# Patient Record
Sex: Female | Born: 1990 | Race: Black or African American | Hispanic: No | Marital: Single | Smoking: Never smoker
Health system: Southern US, Community
[De-identification: ages and names within clinical notes are randomized; demographics above are authoritative.]

## PROBLEM LIST (undated history)

## (undated) DIAGNOSIS — N926 Irregular menstruation, unspecified: Secondary | ICD-10-CM

## (undated) DIAGNOSIS — J45909 Unspecified asthma, uncomplicated: Secondary | ICD-10-CM

## (undated) DIAGNOSIS — R2689 Other abnormalities of gait and mobility: Secondary | ICD-10-CM

## (undated) HISTORY — DX: Unspecified asthma, uncomplicated: J45.909

## (undated) HISTORY — DX: Irregular menstruation, unspecified: N92.6

## (undated) HISTORY — PX: WISDOM TOOTH EXTRACTION: SHX21

## (undated) HISTORY — DX: Other abnormalities of gait and mobility: R26.89

---

## 2015-08-24 DIAGNOSIS — Z3202 Encounter for pregnancy test, result negative: Secondary | ICD-10-CM | POA: Diagnosis not present

## 2015-08-24 DIAGNOSIS — Z30430 Encounter for insertion of intrauterine contraceptive device: Secondary | ICD-10-CM | POA: Diagnosis not present

## 2015-09-10 DIAGNOSIS — R102 Pelvic and perineal pain: Secondary | ICD-10-CM | POA: Diagnosis not present

## 2015-09-24 DIAGNOSIS — N926 Irregular menstruation, unspecified: Secondary | ICD-10-CM | POA: Diagnosis not present

## 2015-09-27 DIAGNOSIS — Z30432 Encounter for removal of intrauterine contraceptive device: Secondary | ICD-10-CM | POA: Diagnosis not present

## 2015-09-27 DIAGNOSIS — Z30011 Encounter for initial prescription of contraceptive pills: Secondary | ICD-10-CM | POA: Diagnosis not present

## 2016-04-28 DIAGNOSIS — H5213 Myopia, bilateral: Secondary | ICD-10-CM | POA: Diagnosis not present

## 2016-08-04 DIAGNOSIS — N926 Irregular menstruation, unspecified: Secondary | ICD-10-CM | POA: Diagnosis not present

## 2016-08-04 DIAGNOSIS — Z30011 Encounter for initial prescription of contraceptive pills: Secondary | ICD-10-CM | POA: Diagnosis not present

## 2016-08-19 ENCOUNTER — Emergency Department (HOSPITAL_COMMUNITY)
Admission: EM | Admit: 2016-08-19 | Discharge: 2016-08-19 | Disposition: A | Discharge status: Home / Self Care | Payer: BLUE CROSS/BLUE SHIELD | Attending: Emergency Medicine | Admitting: Emergency Medicine

## 2016-08-19 ENCOUNTER — Encounter (HOSPITAL_COMMUNITY): Payer: Self-pay | Admitting: Emergency Medicine

## 2016-08-19 DIAGNOSIS — R531 Weakness: Secondary | ICD-10-CM | POA: Insufficient documentation

## 2016-08-19 DIAGNOSIS — R42 Dizziness and giddiness: Secondary | ICD-10-CM | POA: Diagnosis not present

## 2016-08-19 LAB — COMPREHENSIVE METABOLIC PANEL
ALT: 28 U/L (ref 14–54)
ANION GAP: 10 (ref 5–15)
AST: 26 U/L (ref 15–41)
Albumin: 4.6 g/dL (ref 3.5–5.0)
Alkaline Phosphatase: 52 U/L (ref 38–126)
BILIRUBIN TOTAL: 0.7 mg/dL (ref 0.3–1.2)
BUN: 10 mg/dL (ref 6–20)
CHLORIDE: 104 mmol/L (ref 101–111)
CO2: 21 mmol/L — ABNORMAL LOW (ref 22–32)
Calcium: 9.5 mg/dL (ref 8.9–10.3)
Creatinine, Ser: 0.74 mg/dL (ref 0.44–1.00)
Glucose, Bld: 101 mg/dL — ABNORMAL HIGH (ref 65–99)
POTASSIUM: 3.5 mmol/L (ref 3.5–5.1)
Sodium: 135 mmol/L (ref 135–145)
TOTAL PROTEIN: 7.9 g/dL (ref 6.5–8.1)

## 2016-08-19 LAB — CBC
HEMATOCRIT: 44.7 % (ref 36.0–46.0)
Hemoglobin: 14.7 g/dL (ref 12.0–15.0)
MCH: 27.6 pg (ref 26.0–34.0)
MCHC: 32.9 g/dL (ref 30.0–36.0)
MCV: 84 fL (ref 78.0–100.0)
Platelets: 264 10*3/uL (ref 150–400)
RBC: 5.32 MIL/uL — AB (ref 3.87–5.11)
RDW: 13.6 % (ref 11.5–15.5)
WBC: 4.9 10*3/uL (ref 4.0–10.5)

## 2016-08-19 LAB — URINALYSIS, ROUTINE W REFLEX MICROSCOPIC
Bilirubin Urine: NEGATIVE
GLUCOSE, UA: NEGATIVE mg/dL
Hgb urine dipstick: NEGATIVE
KETONES UR: NEGATIVE mg/dL
LEUKOCYTES UA: NEGATIVE
Nitrite: NEGATIVE
PH: 6 (ref 5.0–8.0)
Protein, ur: NEGATIVE mg/dL
SPECIFIC GRAVITY, URINE: 1.008 (ref 1.005–1.030)

## 2016-08-19 LAB — LIPASE, BLOOD: LIPASE: 30 U/L (ref 11–51)

## 2016-08-19 LAB — I-STAT BETA HCG BLOOD, ED (MC, WL, AP ONLY): I-stat hCG, quantitative: <5 m[IU]/mL (ref <5)

## 2016-08-19 NOTE — ED Provider Notes (Signed)
[facility]-EMERGENCY DEPT Provider Note   CSN: [id number] Arrival date & time: 08/19/16  1307     History   Chief Complaint Chief Complaint  Patient presents with  . Weakness    HPI Kelly Thompson is a 26 y.o. female.  HPI Pt comes in with cc of weakness. Pt has no medical hx, she is on an OCP. She reports that for the last several days she has been feeling weak. Pt denies emesis, fevers, chills, chest pains, shortness of breath, cough, headaches, abdominal pain, uti like symptoms. ROS is + for nausea, anorexia. Pt denies drug use and there is no family hx of autoimmune conditions. Pt has no numbness, tingling. She denies tick bites, rash, sick contacts, outdoor exposures, tropical vacations. Pt reports that she gets dizzy sometimes when she gets up or when she is ambulating. She has had near fainting, but no syncope.  Pt was seen at [facility], and she was asked to come to the ER.  History reviewed. No pertinent past medical history.  There are no active problems to display for this patient.   History reviewed. No pertinent surgical history.  OB History    No data available       Home Medications    Prior to Admission medications   Not on File    Family History History reviewed. No pertinent family history.  Social History Social History  Substance Use Topics  . Smoking status: Never Smoker  . Smokeless tobacco: Never Used  . Alcohol use Yes     Comment: occ     Allergies   Patient has no known allergies.   Review of Systems Review of Systems  Constitutional: Positive for activity change, appetite change and fatigue. Negative for unexpected weight change.  Respiratory: Negative for shortness of breath.   Cardiovascular: Negative for chest pain.  Gastrointestinal: Positive for nausea. Negative for diarrhea.  Neurological: Positive for weakness.  All other systems reviewed and are negative.    Physical Exam Updated Vital Signs BP 116/70   Pulse 60   Temp 98.6  F (37 C) (Oral)   Resp 16   SpO2 100%   Physical Exam  Constitutional: She is oriented to person, place, and time. She appears well-developed and well-nourished.  HENT:  Head: Normocephalic and atraumatic.  Eyes: EOM are normal.  Neck: Neck supple.  Cardiovascular: Normal rate.   Musculoskeletal: She exhibits no edema.  Neurological: She is alert and oriented to person, place, and time.  Nursing note and vitals reviewed.    ED Treatments / Results  Labs (all labs ordered are listed, but only abnormal results are displayed) Labs Reviewed  COMPREHENSIVE METABOLIC PANEL - Abnormal; Notable for the following:       Result Value   CO2 21 (*)    Glucose, Bld 101 (*)    All other components within normal limits  CBC - Abnormal; Notable for the following:    RBC 5.32 (*)    All other components within normal limits  URINALYSIS, ROUTINE W REFLEX MICROSCOPIC - Abnormal; Notable for the following:    Color, Urine STRAW (*)    All other components within normal limits  LIPASE, BLOOD  I-STAT BETA HCG BLOOD, ED ([facility], [facility], [facility] ONLY)    EKG  EKG Interpretation None       Radiology No results found.  Procedures Procedures (including critical care time)  Medications Ordered in ED Medications - No data to display   Initial Impression / Assessment and Plan /  ED Course  I have reviewed the triage vital signs and the nursing notes.  Pertinent labs & imaging results that were available during my care of the patient were reviewed by me and considered in my medical decision making (see chart for details).     Pt comes in with generalized weakness and dizziness/  Near fainting. I dont think pt has PE. She is on OCP, but has no signs of DVT and there is no chest pain, dib. EKG is normal. And PE wouldn't explain the generalized weakness.  Pt hx is completely clear from medical point.  She was started on anti-depressants ant one point - and she is having some + on depression  screen.   Advised PCP f/u.  Multiple PCP f/u info given.  Final Clinical Impressions(s) / ED Diagnoses   Final diagnoses:  Generalized weakness    New Prescriptions New Prescriptions   No medications on file     Derwood KaplanNanavati, Ajeenah Heiny, MD 08/19/16 2005

## 2016-08-19 NOTE — ED Triage Notes (Signed)
[facility] and fatigue [facility] several weeks

## 2016-08-19 NOTE — Discharge Instructions (Signed)
We saw you in the ER for the weakness, near fainting. All the results in the ER are normal, labs and imaging. We are not sure what is causing your symptoms. The workup in the ER is not complete, and is limited to screening for life threatening and emergent conditions only, so please see a primary care doctor for further evaluation.

## 2016-08-19 NOTE — ED Notes (Signed)
Discharge vitals in and pt getting dressed at this time.  

## 2016-08-20 ENCOUNTER — Telehealth: Payer: Self-pay | Admitting: Surgery

## 2016-08-20 DIAGNOSIS — R42 Dizziness and giddiness: Secondary | ICD-10-CM | POA: Diagnosis not present

## 2016-08-20 NOTE — Telephone Encounter (Signed)
ED CM received consult to assist with establishing primary care. CM contacted patient to discuss establishing  primary care, patient reports [employer] to be her insurance coverage. CM explained that she should contact her insurance regarding finding a PCP in network accepting new patient's. Patient verbalized understanding teach back done. No further ED CM needs identified.

## 2016-08-21 DIAGNOSIS — R5383 Other fatigue: Secondary | ICD-10-CM | POA: Diagnosis not present

## 2016-08-21 DIAGNOSIS — R946 Abnormal results of thyroid function studies: Secondary | ICD-10-CM | POA: Diagnosis not present

## 2016-09-09 ENCOUNTER — Inpatient Hospital Stay (INDEPENDENT_AMBULATORY_CARE_PROVIDER_SITE_OTHER): Payer: BLUE CROSS/BLUE SHIELD | Admitting: Physician Assistant

## 2016-10-13 ENCOUNTER — Ambulatory Visit: Payer: BLUE CROSS/BLUE SHIELD | Admitting: Neurology

## 2016-10-15 ENCOUNTER — Encounter: Payer: Self-pay | Admitting: Neurology

## 2016-11-10 ENCOUNTER — Other Ambulatory Visit: Payer: Self-pay | Admitting: Obstetrics and Gynecology

## 2016-11-10 ENCOUNTER — Other Ambulatory Visit (HOSPITAL_COMMUNITY)
Admission: RE | Admit: 2016-11-10 | Discharge: 2016-11-10 | Disposition: A | Discharge status: Home / Self Care | Payer: BLUE CROSS/BLUE SHIELD | Source: Ambulatory Visit | Attending: Obstetrics and Gynecology | Admitting: Obstetrics and Gynecology

## 2016-11-10 DIAGNOSIS — Z124 Encounter for screening for malignant neoplasm of cervix: Secondary | ICD-10-CM | POA: Diagnosis not present

## 2016-11-10 DIAGNOSIS — Z01411 Encounter for gynecological examination (general) (routine) with abnormal findings: Secondary | ICD-10-CM | POA: Diagnosis not present

## 2016-11-11 ENCOUNTER — Other Ambulatory Visit: Payer: Self-pay | Admitting: Obstetrics and Gynecology

## 2016-11-11 DIAGNOSIS — N644 Mastodynia: Secondary | ICD-10-CM

## 2016-11-12 LAB — CYTOLOGY - PAP
Chlamydia: NEGATIVE
DIAGNOSIS: NEGATIVE
NEISSERIA GONORRHEA: NEGATIVE

## 2016-12-08 ENCOUNTER — Ambulatory Visit
Admission: RE | Admit: 2016-12-08 | Discharge: 2016-12-08 | Disposition: A | Discharge status: Home / Self Care | Payer: BLUE CROSS/BLUE SHIELD | Source: Ambulatory Visit | Attending: Obstetrics and Gynecology | Admitting: Obstetrics and Gynecology

## 2016-12-08 DIAGNOSIS — N6489 Other specified disorders of breast: Secondary | ICD-10-CM | POA: Diagnosis not present

## 2016-12-08 DIAGNOSIS — N644 Mastodynia: Secondary | ICD-10-CM

## 2016-12-26 DIAGNOSIS — R531 Weakness: Secondary | ICD-10-CM | POA: Diagnosis not present

## 2017-02-23 DIAGNOSIS — R531 Weakness: Secondary | ICD-10-CM | POA: Diagnosis not present

## 2017-02-23 DIAGNOSIS — R29898 Other symptoms and signs involving the musculoskeletal system: Secondary | ICD-10-CM | POA: Diagnosis not present

## 2017-04-21 DIAGNOSIS — Z3202 Encounter for pregnancy test, result negative: Secondary | ICD-10-CM | POA: Diagnosis not present

## 2017-04-21 DIAGNOSIS — R102 Pelvic and perineal pain: Secondary | ICD-10-CM | POA: Diagnosis not present

## 2017-05-28 DIAGNOSIS — R102 Pelvic and perineal pain: Secondary | ICD-10-CM | POA: Diagnosis not present

## 2017-06-11 DIAGNOSIS — R102 Pelvic and perineal pain: Secondary | ICD-10-CM | POA: Diagnosis not present

## 2017-11-16 ENCOUNTER — Other Ambulatory Visit (HOSPITAL_COMMUNITY)
Admission: RE | Admit: 2017-11-16 | Discharge: 2017-11-16 | Disposition: A | Discharge status: Home / Self Care | Payer: BLUE CROSS/BLUE SHIELD | Source: Ambulatory Visit | Attending: Obstetrics and Gynecology | Admitting: Obstetrics and Gynecology

## 2017-11-16 ENCOUNTER — Other Ambulatory Visit: Payer: Self-pay | Admitting: Obstetrics and Gynecology

## 2017-11-16 DIAGNOSIS — Z01411 Encounter for gynecological examination (general) (routine) with abnormal findings: Secondary | ICD-10-CM | POA: Diagnosis not present

## 2017-11-16 DIAGNOSIS — Z113 Encounter for screening for infections with a predominantly sexual mode of transmission: Secondary | ICD-10-CM | POA: Diagnosis not present

## 2017-11-16 DIAGNOSIS — Z01419 Encounter for gynecological examination (general) (routine) without abnormal findings: Secondary | ICD-10-CM | POA: Diagnosis not present

## 2017-11-17 LAB — CYTOLOGY - PAP
CHLAMYDIA, DNA PROBE: NEGATIVE
DIAGNOSIS: NEGATIVE
NEISSERIA GONORRHEA: NEGATIVE

## 2018-01-04 DIAGNOSIS — R109 Unspecified abdominal pain: Secondary | ICD-10-CM | POA: Diagnosis not present

## 2018-01-20 DIAGNOSIS — K219 Gastro-esophageal reflux disease without esophagitis: Secondary | ICD-10-CM | POA: Diagnosis not present

## 2018-02-08 DIAGNOSIS — R195 Other fecal abnormalities: Secondary | ICD-10-CM | POA: Diagnosis not present

## 2018-02-08 DIAGNOSIS — K219 Gastro-esophageal reflux disease without esophagitis: Secondary | ICD-10-CM | POA: Diagnosis not present

## 2018-03-02 DIAGNOSIS — R197 Diarrhea, unspecified: Secondary | ICD-10-CM | POA: Diagnosis not present

## 2018-03-04 DIAGNOSIS — Z8619 Personal history of other infectious and parasitic diseases: Secondary | ICD-10-CM | POA: Diagnosis not present

## 2018-03-04 DIAGNOSIS — R102 Pelvic and perineal pain: Secondary | ICD-10-CM | POA: Diagnosis not present

## 2018-03-04 DIAGNOSIS — N898 Other specified noninflammatory disorders of vagina: Secondary | ICD-10-CM | POA: Diagnosis not present

## 2018-03-16 DIAGNOSIS — R102 Pelvic and perineal pain: Secondary | ICD-10-CM | POA: Diagnosis not present

## 2018-12-29 ENCOUNTER — Other Ambulatory Visit (HOSPITAL_COMMUNITY)
Admission: RE | Admit: 2018-12-29 | Discharge: 2018-12-29 | Disposition: A | Discharge status: Home / Self Care | Payer: BC Managed Care – PPO | Source: Ambulatory Visit | Attending: Obstetrics and Gynecology | Admitting: Obstetrics and Gynecology

## 2018-12-29 ENCOUNTER — Other Ambulatory Visit: Payer: Self-pay | Admitting: Obstetrics and Gynecology

## 2018-12-29 DIAGNOSIS — Z01419 Encounter for gynecological examination (general) (routine) without abnormal findings: Secondary | ICD-10-CM | POA: Diagnosis present

## 2019-01-03 LAB — CYTOLOGY - PAP
Adequacy: ABSENT
Chlamydia: NEGATIVE
Diagnosis: NEGATIVE
Neisseria Gonorrhea: NEGATIVE

## 2019-04-01 NOTE — L&D Delivery Note (Signed)
Kelly Thompson is a 29 y.o. G1P0 s/p SVD at [redacted]w[redacted]d. She was admitted for SOL.   ROM: 23h 72m with yellowish fluid GBS Status: Negative Maximum Maternal Temperature: 102.8  Labor Progress: . Patient was initially planning a waterbirth and was laboring natural. AROM performed @ 0523 on 10/25. Stalled progression throughout of the morning of 10/26 and pitocin augmentation initiated. Patient proceeded to get an epidural. She continued to progress to complete and pushed 45 minutes to deliver.   Delivery Date/Time: 01/24/20 @ 0518 Delivery:  Called to room and patient was complete and pushing. Head delivered OA. No nuchal cord present. Shoulder and body delivered in usual fashion. Infant with spontaneous cry, placed on mother's abdomen, dried and stimulated. Cord clamped x 2 after 1-minute delay, and cut by father of baby Kelly Thompson. Cord blood drawn. Placenta delivered spontaneously with gentle cord traction- patient plans to keep placenta and placed in cooler. Fundus firm with massage and Pitocin. Labia, perineum, vagina, and cervix inspected inspected with left labial superficial laceration, hemostatic and not repaired.   . After about 5 minutes baby boy "Kelly Thompson" started having retractions and increased thick yellow secretions. Taken over to warmer for stimulation and suction. Neonatologist notified and called to room. Blow by oxygen initiated and baby taken to NICU for transitioning and continued oxygen at this time.   Mom prior to transfer to postpartum. Baby to NICU at this time. She plans on breastfeeding. She requests IUD for birth control- patient plans to be done in office at postpartum appointment.  Delivery Note At 5:18 AM a viable and healthy female was delivered via Vaginal, Spontaneous (Presentation:   Occiput Anterior).  APGAR: 7, 8; weight pending.   Placenta status: Spontaneous, Intact.  Cord: 3 vessels with the following complications: None.   Anesthesia: Epidural Episiotomy:  None Lacerations: Labial Suture Repair: None Est. Blood Loss (mL): 75  Mom to postpartum.  Baby to NICU.  Kelly Thompson CNM 01/24/2020, 5:58 AM

## 2019-05-12 LAB — HCG, SERUM, QUALITATIVE: Beta hcg, urine, semiquantitative: POSITIVE

## 2019-06-13 LAB — OB RESULTS CONSOLE HGB/HCT, BLOOD
HCT: 43 — AB (ref 29–41)
Hemoglobin: 14.1

## 2019-06-13 LAB — OB RESULTS CONSOLE RPR: RPR: NONREACTIVE

## 2019-06-13 LAB — OB RESULTS CONSOLE HIV ANTIBODY (ROUTINE TESTING): HIV: NONREACTIVE

## 2019-06-13 LAB — URINALYSIS, MANUAL ONLY
Bilirubin (Urine): NEGATIVE
Blood, UA: NEGATIVE
Glucose, Ur: NEGATIVE
Ketones, urine: NEGATIVE
Leukocyte Esterase: NEGATIVE
Nitrite, UA: NEGATIVE
Protein, Ur: NEGATIVE
Specific Gravity: <=1.005
Urobilinogen, UA: 0.2
pH: 6.5

## 2019-06-13 LAB — OB RESULTS CONSOLE RUBELLA ANTIBODY, IGM: Rubella: IMMUNE

## 2019-06-13 LAB — OB RESULTS CONSOLE ABO/RH: RH Type: POSITIVE

## 2019-06-13 LAB — HGB FRAC BY HPLC+SOLUBILITY: Hgb Solubility: NEGATIVE

## 2019-06-13 LAB — HIV ANTIBODY (ROUTINE TESTING W REFLEX): HIV Screen 4th Generation wRfx: NONREACTIVE

## 2019-06-13 LAB — PREGNANCY, URINE: Preg Test, Ur: POSITIVE

## 2019-06-13 LAB — CULTURE, OB URINE: Urine Culture, OB: NORMAL

## 2019-06-13 LAB — OB RESULTS CONSOLE PLATELET COUNT: Platelets: 272

## 2019-06-13 LAB — OB RESULTS CONSOLE ANTIBODY SCREEN: Antibody Screen: NEGATIVE

## 2019-06-13 LAB — OB RESULTS CONSOLE HEPATITIS B SURFACE ANTIGEN: Hepatitis B Surface Ag: NEGATIVE

## 2019-07-18 LAB — OB RESULTS CONSOLE GC/CHLAMYDIA
Chlamydia: NEGATIVE
Gonorrhea: NEGATIVE

## 2019-08-26 LAB — AFP TETRA
AFP MoM: 1.17
AFP Value, Am: 64.1
AFP: NEGATIVE
DIA MoM: 132.32
DSR (By Age     1 IN: 918
DSR (Second Trimester) 1 IN: 8516
GESTATIONAL AGE: 19.4
HCG MoM: 1.46
Maternal Age At EDD: 27.1
OSBR Risk 1 IN: 10000
T18 (By Age): 1:3576 {titer}
Test Results:: NEGATIVE
Weight: 169
hCG Value: 33569
uE3 MoM: 0.91
uE3 Value: 1.72

## 2019-09-07 ENCOUNTER — Inpatient Hospital Stay (HOSPITAL_COMMUNITY)
Admission: AD | Admit: 2019-09-07 | Discharge: 2019-09-07 | Disposition: A | Discharge status: Home / Self Care | Payer: Medicaid Other | Attending: Obstetrics & Gynecology | Admitting: Obstetrics & Gynecology

## 2019-09-07 ENCOUNTER — Other Ambulatory Visit: Payer: Self-pay

## 2019-09-07 ENCOUNTER — Encounter (HOSPITAL_COMMUNITY): Payer: Self-pay | Admitting: Obstetrics & Gynecology

## 2019-09-07 DIAGNOSIS — Z3A21 21 weeks gestation of pregnancy: Secondary | ICD-10-CM

## 2019-09-07 DIAGNOSIS — O99412 Diseases of the circulatory system complicating pregnancy, second trimester: Secondary | ICD-10-CM | POA: Diagnosis not present

## 2019-09-07 DIAGNOSIS — R002 Palpitations: Secondary | ICD-10-CM | POA: Diagnosis not present

## 2019-09-07 LAB — CBC
HCT: 36.7 % (ref 36.0–46.0)
Hemoglobin: 12 g/dL (ref 12.0–15.0)
MCH: 27.7 pg (ref 26.0–34.0)
MCHC: 32.7 g/dL (ref 30.0–36.0)
MCV: 84.8 fL (ref 80.0–100.0)
Platelets: 236 10*3/uL (ref 150–400)
RBC: 4.33 MIL/uL (ref 3.87–5.11)
RDW: 13.9 % (ref 11.5–15.5)
WBC: 8 10*3/uL (ref 4.0–10.5)
nRBC: 0 % (ref 0.0–0.2)

## 2019-09-07 LAB — COMPREHENSIVE METABOLIC PANEL
ALT: 18 U/L (ref 0–44)
AST: 17 U/L (ref 15–41)
Albumin: 3.1 g/dL — ABNORMAL LOW (ref 3.5–5.0)
Alkaline Phosphatase: 48 U/L (ref 38–126)
Anion gap: 11 (ref 5–15)
BUN: 6 mg/dL (ref 6–20)
CO2: 23 mmol/L (ref 22–32)
Calcium: 9.2 mg/dL (ref 8.9–10.3)
Chloride: 102 mmol/L (ref 98–111)
Creatinine, Ser: 0.6 mg/dL (ref 0.44–1.00)
GFR calc Af Amer: >60 mL/min (ref >60)
GFR calc non Af Amer: >60 mL/min (ref >60)
Glucose, Bld: 81 mg/dL (ref 70–99)
Potassium: 3.3 mmol/L — ABNORMAL LOW (ref 3.5–5.1)
Sodium: 136 mmol/L (ref 135–145)
Total Bilirubin: 0.3 mg/dL (ref 0.3–1.2)
Total Protein: 6.6 g/dL (ref 6.5–8.1)

## 2019-09-07 LAB — TSH: TSH: 0.828 u[IU]/mL (ref 0.350–4.500)

## 2019-09-07 NOTE — Discharge Instructions (Signed)
Palpitations Palpitations are feelings that your heartbeat is not normal. Your heartbeat may feel like it is:  Uneven.  Faster than normal.  Fluttering.  Skipping a beat. This is usually not a serious problem. In some cases, you may need tests to rule out any serious problems. Follow these instructions at home: Pay attention to any changes in your condition. Take these actions to help manage your symptoms: Eating and drinking  Avoid: ? Coffee, tea, soft drinks, and energy drinks. ? Chocolate. ? Alcohol. ? Diet pills. Lifestyle   Try to lower your stress. These things can help you relax: ? Yoga. ? Deep breathing and meditation. ? Exercise. ? Using words and images to create positive thoughts (guided imagery). ? Using your mind to control things in your body (biofeedback).  Do not use drugs.  Get plenty of rest and sleep. Keep a regular bed time. General instructions   Take over-the-counter and prescription medicines only as told by your doctor.  Do not use any products that contain nicotine or tobacco, such as cigarettes and e-cigarettes. If you need help quitting, ask your doctor.  Keep all follow-up visits as told by your doctor. This is important. You may need more tests if palpitations do not go away or get worse. Contact a doctor if:  Your symptoms last more than 24 hours.  Your symptoms occur more often. Get help right away if you:  Have chest pain.  Feel short of breath.  Have a very bad headache.  Feel dizzy.  Pass out (faint). Summary  Palpitations are feelings that your heartbeat is uneven or faster than normal. It may feel like your heart is fluttering or skipping a beat.  Avoid food and drinks that may cause palpitations. These include caffeine, chocolate, and alcohol.  Try to lower your stress. Do not smoke or use drugs.  Get help right away if you faint or have chest pain, shortness of breath, a severe headache, or dizziness. This  information is not intended to replace advice given to you by your health care provider. Make sure you discuss any questions you have with your health care provider. Document Revised: 04/29/2017 Document Reviewed: 04/29/2017 Elsevier Patient Education  2020 Elsevier Inc.  

## 2019-09-07 NOTE — MAU Provider Note (Signed)
History     CSN: [id number]  Arrival date and time: 09/07/19 1850   None     Chief Complaint  Patient presents with  . Fatigue  . Palpitations   HPI   Ms.Kelly Thompson is a 29 y.o. female G1P0 @ [redacted]w[redacted]d here with palpitations X 2 weeks. At first she was feeling them sporadically, recently she has been feeling them several times per day. Once they start nothing makes them better or worse. She notices the symptoms worse at night when she is laying down. She feels them less when she is up; however does overall feel fatigued.  Hx of anxiety; not medicated.  No chest pain.   OB History    Gravida  1   Para      Term      Preterm      AB      Living  0     SAB      TAB      Ectopic      Multiple      Live Births              Past Medical History:  Diagnosis Date  . Balance problem   . Irregular menstrual bleeding     Past Surgical History:  Procedure Laterality Date  . WISDOM TOOTH EXTRACTION      Family History  Problem Relation Age of Onset  . Diabetes Maternal Grandmother   . Heart disease Maternal Grandmother   . Hypertension Paternal Grandmother   . Glaucoma Paternal Grandmother     Social History   Tobacco Use  . Smoking status: Never Smoker  . Smokeless tobacco: Never Used  Substance Use Topics  . Alcohol use: Yes    Comment: occ  . Drug use: No    Allergies: No Known Allergies  Medications Prior to Admission  Medication Sig Dispense Refill Last Dose  . Prenatal Vit-Fe Fumarate-FA (PRENATAL MULTIVITAMIN) TABS tablet Take 1 tablet by mouth daily at 12 noon.     . Multiple Vitamin (MULTIVITAMIN) tablet Take 1 tablet by mouth daily.     . norgestimate-ethinyl estradiol (ORTHO-CYCLEN,SPRINTEC,PREVIFEM) 0.25-35 MG-MCG tablet Take 1 tablet by mouth daily.      Recent Results (from the past 2160 hour(s))  CBC     Status: None   Collection Time: 09/07/19  8:10 PM  Result Value Ref Range   WBC 8.0 4.0 - 10.5 K/uL   RBC 4.33 3.87 - 5.11  MIL/uL   Hemoglobin 12.0 12.0 - 15.0 g/dL   HCT 36.7 36 - 46 %   MCV 84.8 80.0 - 100.0 fL   MCH 27.7 26.0 - 34.0 pg   MCHC 32.7 30.0 - 36.0 g/dL   RDW 13.9 11.5 - 15.5 %   Platelets 236 150 - 400 K/uL   nRBC 0.0 0.0 - 0.2 %    Comment: Performed at [facility], [place] [address] [place], [place] [address]  Comprehensive metabolic panel     Status: Abnormal   Collection Time: 09/07/19  8:10 PM  Result Value Ref Range   Sodium 136 135 - 145 mmol/L   Potassium 3.3 (L) 3.5 - 5.1 mmol/L   Chloride 102 98 - 111 mmol/L   CO2 23 22 - 32 mmol/L   Glucose, Bld 81 70 - 99 mg/dL    Comment: Glucose reference range applies only to samples taken after fasting for at least 8 hours.   BUN 6 6 - 20 mg/dL  Creatinine, Ser 0.60 0.44 - 1.00 mg/dL   Calcium 9.2 8.9 - 87.5 mg/dL   Total Protein 6.6 6.5 - 8.1 g/dL   Albumin 3.1 (L) 3.5 - 5.0 g/dL   AST 17 15 - 41 U/L   ALT 18 0 - 44 U/L   Alkaline Phosphatase 48 38 - 126 U/L   Total Bilirubin 0.3 0.3 - 1.2 mg/dL   GFR calc non Af Amer >60 >60 mL/min   GFR calc Af Amer >60 >60 mL/min   Anion gap 11 5 - 15    Comment: Performed at [facility], [address], [place], [place] [address]  TSH     Status: None   Collection Time: 09/07/19  8:10 PM  Result Value Ref Range   TSH 0.828 0.350 - 4.500 uIU/mL    Comment: Performed by a 3rd Generation assay with a functional sensitivity of <=0.01 uIU/mL. Performed at [facility], [address], [place], [place] [address]    Review of Systems  Constitutional: Positive for fatigue.  Cardiovascular: Positive for palpitations. Negative for chest pain.   Physical Exam   Blood pressure 112/68, pulse 70, temperature 98.5 F (36.9 C), resp. rate 18, height 5\' 5"  (1.651 m), weight 77.1 kg, SpO2 100 %.  Physical Exam  Constitutional: She is oriented to person, place, and time.  Cardiovascular: Normal rate.  Murmur heard. Musculoskeletal:        General: Normal range of motion.   Neurological: She is alert and oriented to person, place, and time.  Skin: Skin is warm.  Psychiatric: Her behavior is normal.    MAU Course  Procedures  None  MDM  + fetal heart tones via doppler  EKG:  Normal sinus rhythm.  TSH, CBC, CMP Message left with On-Call service for [facility], patient needs f/u with cardiology.   Assessment and Plan   A:  1. Intermittent palpitations   2. [redacted] weeks gestation of pregnancy     P:  Patient stable at this time EKG normal Needs cardiology workup. Return to MAU if symptoms worsen  Kenyia Wambolt, [facility], NP 09/10/2019 9:19 AM

## 2019-09-07 NOTE — MAU Note (Signed)
Past 2 wks I have felt very tired and have heart palpitations at least once an hour. Sometimes feels like my heart is beating out of my chest. Then other times heart rate feels slow. Have been having a lot of headaches recently. Sometimes I have abd cramping maybe once a day but the palpitations is why I came in. I never had palpitations before. I called office last Fri and asked to be seen. They called yesterday and wanted me to have covid test first. Test was neg so today they told me to come to MAU or call to have appt at office moved up.

## 2019-09-12 ENCOUNTER — Telehealth: Payer: Self-pay | Admitting: Obstetrics and Gynecology

## 2019-09-12 NOTE — Telephone Encounter (Signed)
I attempted to contact patient on 09/12/19 to schedule visit for referral. The patient didn't answer so I left message for patient to return call.

## 2019-09-14 DIAGNOSIS — R002 Palpitations: Secondary | ICD-10-CM | POA: Insufficient documentation

## 2019-09-14 DIAGNOSIS — Z7189 Other specified counseling: Secondary | ICD-10-CM | POA: Insufficient documentation

## 2019-09-14 NOTE — Progress Notes (Signed)
Seen    Cardiology Office Note   Date:  09/15/2019   ID:  Kelly Thompson, DOB 12-01-1990, MRN [id number]  PCP:  Patient, No Pcp Per  Cardiologist:   No primary care provider on file. Referring:  Christophe Louis, MD  Chief Complaint  Patient presents with  . Palpitations      History of Present Illness: Kelly Thompson is a 28 y.o. female who is referred by Christophe Louis, MD for evaluation of palpitations.  She is [redacted] weeks pregnant.  Apparently in the emergency room moderate: It sounds like this was a GYN area.  I do not have an ER visit.  I do see blood work and an EKG that was done on the ninth of this month.  She was dizzy.  Her blood work was unremarkable.  Her EKG is unremarkable.  She is describing palpitations.  These have been going on for about 3 to 4 weeks.  Her pregnancy has otherwise been okay.  However, she now feels lightheaded sometimes when she is standing up.  She feels like her blood pressure drops.  She does not take her blood pressure but she is going to start doing this.  She has noticed her heart rate going up at times.  She has not been able to count this will quantify this.  This happens sometimes when she is standing up and getting dizzy but it does not seem to be positional.  It happens more frequently when she is lying down.  It seems to be sudden in onset and offset.  She feels weak and fatigued.  The heart rate is fast but she says is not particularly irregular.  She has never had a cardiac problem before.  She has not shortness of breath, PND or orthopnea.  She has not had any chest pressure, neck or arm discomfort.  She has had no weight gain or edema.     Past Medical History:  Diagnosis Date  . Irregular menstrual bleeding     Past Surgical History:  Procedure Laterality Date  . WISDOM TOOTH EXTRACTION       Current Outpatient Medications  Medication Sig Dispense Refill  . Prenatal Vit-Fe Fumarate-FA (PRENATAL MULTIVITAMIN) TABS tablet Take 1 tablet by mouth daily  at 12 noon.     No current facility-administered medications for this visit.    Allergies:   Patient has no known allergies.    Social History:  The patient  reports that she has never smoked. She has never used smokeless tobacco. She reports current alcohol use. She reports that she does not use drugs.   Family History:  The patient's family history includes Diabetes in her maternal grandmother; Glaucoma in her paternal grandmother; Heart disease in her maternal grandmother; Hypertension in her paternal grandmother.  There is no family history of sudden death   ROS:  Please see the history of present illness.   Otherwise, review of systems are positive for none.   All other systems are reviewed and negative.    PHYSICAL EXAM: VS:  BP 113/71 (BP Location: Right Arm, Patient Position: Standing, Cuff Size: Large)   Pulse 89   Temp (!) 97.2 F (36.2 C)   Ht 5' 5.5" (1.664 m)   Wt 176 lb (79.8 kg)   SpO2 99%   BMI 28.84 kg/m  , BMI Body mass index is 28.84 kg/m. GENERAL:  Well appearing HEENT:  Pupils equal round and reactive, fundi not visualized, oral mucosa unremarkable NECK:  No jugular venous  distention, waveform within normal limits, carotid upstroke brisk and symmetric, no bruits, no thyromegaly LYMPHATICS:  No cervical, inguinal adenopathy LUNGS:  Clear to auscultation bilaterally BACK:  No CVA tenderness CHEST:  Unremarkable HEART:  PMI not displaced or sustained,S1 and S2 within normal limits, no S3, no S4, no clicks, no rubs, soft apical brief systolic murmur and heard best at the right upper sternal border, no diastolic murmurs ABD:  Flat, positive bowel sounds normal in frequency in pitch, no bruits, no rebound, no guarding, no midline pulsatile mass, no hepatomegaly, no splenomegaly EXT:  2 plus pulses throughout, no edema, no cyanosis no clubbing SKIN:  No rashes no nodules NEURO:  Cranial nerves II through XII grossly intact, motor grossly intact throughout PSYCH:   Cognitively intact, oriented to person place and time    EKG:  EKG is ordered today. The ekg ordered today demonstrates sinus rhythm, 83, axis, intervals within normal limits, no acute ST-T wave changes.   Recent Labs: 09/07/2019: ALT 18; BUN 6; Creatinine, Ser 0.60; Hemoglobin 12.0; Platelets 236; Potassium 3.3; Sodium 136; TSH 0.828    Lipid Panel No results found for: CHOL, TRIG, HDL, CHOLHDL, VLDL, LDLCALC, LDLDIRECT    Wt Readings from Last 3 Encounters:  09/15/19 176 lb (79.8 kg)  09/07/19 170 lb (77.1 kg)      Other studies Reviewed: Additional studies/ records that were reviewed today include: Labs, EKG. Review of the above records demonstrates:  Please see elsewhere in the note.     ASSESSMENT AND PLAN:  PALPITATIONS:    I am going to apply a 3-day monitor.  Of note she was not orthostatic in the office.  She did have labs to include a TSH and a Bement which were unremarkable.  Because of this and the murmur I will check an echocardiogram.  MURMUR: I suspect that this is a flow murmur but will check the echo as above.    Current medicines are reviewed at length with the patient today.  The patient does not have concerns regarding medicines.  The following changes have been made:  no change  Labs/ tests ordered today include:   Orders Placed This Encounter  Procedures  . LONG TERM MONITOR (3-14 DAYS)  . EKG 12-Lead  . ECHOCARDIOGRAM COMPLETE     Disposition:   FU with me or APP in 3 months.     Signed, Rollene Rotunda, MD  09/15/2019 5:37 PM    [facility]

## 2019-09-15 ENCOUNTER — Encounter: Payer: Self-pay | Admitting: Cardiology

## 2019-09-15 ENCOUNTER — Ambulatory Visit (INDEPENDENT_AMBULATORY_CARE_PROVIDER_SITE_OTHER): Payer: Medicaid Other | Admitting: Cardiology

## 2019-09-15 ENCOUNTER — Other Ambulatory Visit: Payer: Self-pay

## 2019-09-15 VITALS — BP 113/71 | HR 89 | Temp 97.2°F | Ht 65.5 in | Wt 176.0 lb

## 2019-09-15 DIAGNOSIS — R011 Cardiac murmur, unspecified: Secondary | ICD-10-CM

## 2019-09-15 DIAGNOSIS — R002 Palpitations: Secondary | ICD-10-CM

## 2019-09-15 NOTE — Patient Instructions (Addendum)
Medication Instructions:  NO CHANGES *If you need a refill on your cardiac medications before your next appointment, please call your pharmacy*  Lab Work: NONE ORDERED THIS VISIT  Testing/Procedures: Your physician has recommended that you wear an event monitor. Event monitors are medical devices that record the heart's electrical activity. Doctors most often [place] these monitors to diagnose arrhythmias. Arrhythmias are problems with the speed or rhythm of the heartbeat. The monitor is a small, portable device. You can wear one while you do your normal daily activities. This is usually used to diagnose what is causing palpitations/syncope (passing out). [address]  Your physician has recommended that you wear an event monitor. Event monitors are medical devices that record the heart's electrical activity. Doctors most often [place] these monitors to diagnose arrhythmias. Arrhythmias are problems with the speed or rhythm of the heartbeat. The monitor is a small, portable device. You can wear one while you do your normal daily activities. This is usually used to diagnose what is causing palpitations/syncope (passing out).  Follow-Up: At [facility], you and your health needs are our priority.  As part of our continuing mission to provide you with exceptional heart care, we have created designated Provider Care Teams.  These Care Teams include your primary Cardiologist (physician) and Advanced Practice Providers (APPs -  Physician Assistants and Nurse Practitioners) who all work together to provide you with the care you need, when you need it.  Your next appointment:   THURS AUGUST 19 @3 :45PM  The format for your next appointment:   In Person  Provider:   , NP  Other Instructions  [facility]  Your physician has recommended that you wear a 3 DAY ZIO-PATCH monitor. The Zio patch cardiac monitor continuously records heart rhythm data for up to 14 days,  this is for patients being evaluated for multiple types heart rhythms. For the first 24 hours post application, please avoid getting the Zio monitor wet in the shower or by excessive sweating during exercise. After that, feel free to carry on with regular activities. Keep soaps and lotions away from the ZIO XT Patch.  This will be mailed to you, please expect 7-10 days to receive.

## 2019-09-16 ENCOUNTER — Telehealth: Payer: Self-pay | Admitting: Radiology

## 2019-09-16 NOTE — Telephone Encounter (Signed)
Enrolled patient for a 3 day Zio monitor to be mailed to patients home.  

## 2019-09-26 ENCOUNTER — Encounter: Payer: Self-pay | Admitting: Certified Nurse Midwife

## 2019-09-27 ENCOUNTER — Other Ambulatory Visit (INDEPENDENT_AMBULATORY_CARE_PROVIDER_SITE_OTHER): Payer: Medicaid Other

## 2019-09-27 DIAGNOSIS — R002 Palpitations: Secondary | ICD-10-CM | POA: Diagnosis not present

## 2019-10-05 ENCOUNTER — Other Ambulatory Visit: Payer: Self-pay

## 2019-10-05 ENCOUNTER — Ambulatory Visit (INDEPENDENT_AMBULATORY_CARE_PROVIDER_SITE_OTHER): Payer: Medicaid Other | Admitting: Certified Nurse Midwife

## 2019-10-05 ENCOUNTER — Encounter: Payer: Self-pay | Admitting: Certified Nurse Midwife

## 2019-10-05 VITALS — BP 106/65 | HR 80 | Temp 98.0°F | Wt 173.8 lb

## 2019-10-05 DIAGNOSIS — Z3A25 25 weeks gestation of pregnancy: Secondary | ICD-10-CM

## 2019-10-05 DIAGNOSIS — Z3402 Encounter for supervision of normal first pregnancy, second trimester: Secondary | ICD-10-CM

## 2019-10-05 DIAGNOSIS — Z3400 Encounter for supervision of normal first pregnancy, unspecified trimester: Secondary | ICD-10-CM | POA: Insufficient documentation

## 2019-10-05 MED ORDER — BLOOD PRESSURE MONITOR AUTOMAT DEVI: 1.0000 | Freq: Every day | 0 refills | Status: DC

## 2019-10-05 NOTE — Patient Instructions (Addendum)
Second Trimester of Pregnancy  The second trimester is from week 14 through week 27 (month 4 through 6). This is often the time in pregnancy that you feel your best. Often times, morning sickness has lessened or quit. You may have more energy, and you may get hungry more often. Your unborn baby is growing rapidly. At the end of the sixth month, he or she is about 9 inches long and weighs about 1 pounds. You will likely feel the baby move between 18 and 20 weeks of pregnancy. Follow these instructions at home: Medicines  Take over-the-counter and prescription medicines only as told by your doctor. Some medicines are safe and some medicines are not safe during pregnancy.  Take a prenatal vitamin that contains at least 600 micrograms (mcg) of folic acid.  If you have trouble pooping (constipation), take medicine that will make your stool soft (stool softener) if your doctor approves. Eating and drinking   Eat regular, healthy meals.  Avoid raw meat and uncooked cheese.  If you get low calcium from the food you eat, talk to your doctor about taking a daily calcium supplement.  Avoid foods that are high in fat and sugars, such as fried and sweet foods.  If you feel sick to your stomach (nauseous) or throw up (vomit): ? Eat 4 or 5 small meals a day instead of 3 large meals. ? Try eating a few soda crackers. ? Drink liquids between meals instead of during meals.  To prevent constipation: ? Eat foods that are high in fiber, like fresh fruits and vegetables, whole grains, and beans. ? Drink enough fluids to keep your pee (urine) clear or pale yellow. Activity  Exercise only as told by your doctor. Stop exercising if you start to have cramps.  Do not exercise if it is too hot, too humid, or if you are in a place of great height (high altitude).  Avoid heavy lifting.  Wear low-heeled shoes. Sit and stand up straight.  You can continue to have sex unless your doctor tells you not  to. Relieving pain and discomfort  Wear a good support bra if your breasts are tender.  Take warm water baths (sitz baths) to soothe pain or discomfort caused by hemorrhoids. Use hemorrhoid cream if your doctor approves.  Rest with your legs raised if you have leg cramps or low back pain.  If you develop puffy, bulging veins (varicose veins) in your legs: ? Wear support hose or compression stockings as told by your doctor. ? Raise (elevate) your feet for 15 minutes, 3-4 times a day. ? Limit salt in your food. Prenatal care  Write down your questions. Take them to your prenatal visits.  Keep all your prenatal visits as told by your doctor. This is important. Safety  Wear your seat belt when driving.  Make a list of emergency phone numbers, including numbers for family, friends, the hospital, and police and fire departments. General instructions  Ask your doctor about the right foods to eat or for help finding a counselor, if you need these services.  Ask your doctor about local prenatal classes. Begin classes before month 6 of your pregnancy.  Do not use hot tubs, steam rooms, or saunas.  Do not douche or use tampons or scented sanitary pads.  Do not cross your legs for long periods of time.  Visit your dentist if you have not done so. Use a soft toothbrush to brush your teeth. Floss gently.  Avoid all smoking, herbs,   and alcohol. Avoid drugs that are not approved by your doctor.  Do not use any products that contain nicotine or tobacco, such as cigarettes and e-cigarettes. If you need help quitting, ask your doctor.  Avoid cat litter boxes and soil used by cats. These carry germs that can cause birth defects in the baby and can cause a loss of your baby (miscarriage) or stillbirth. Contact a doctor if:  You have mild cramps or pressure in your lower belly.  You have pain when you pee (urinate).  You have bad smelling fluid coming from your vagina.  You continue to  feel sick to your stomach (nauseous), throw up (vomit), or have watery poop (diarrhea).  You have a nagging pain in your belly area.  You feel dizzy. Get help right away if:  You have a fever.  You are leaking fluid from your vagina.  You have spotting or bleeding from your vagina.  You have severe belly cramping or pain.  You lose or gain weight rapidly.  You have trouble catching your breath and have chest pain.  You notice sudden or extreme puffiness (swelling) of your face, hands, ankles, feet, or legs.  You have not felt the baby move in over an hour.  You have severe headaches that do not go away when you take medicine.  You have trouble seeing. Summary  The second trimester is from week 14 through week 27 (months 4 through 6). This is often the time in pregnancy that you feel your best.  To take care of yourself and your unborn baby, you will need to eat healthy meals, take medicines only if your doctor tells you to do so, and do activities that are safe for you and your baby.  Call your doctor if you get sick or if you notice anything unusual about your pregnancy. Also, call your doctor if you need help with the right food to eat, or if you want to know what activities are safe for you. This information is not intended to replace advice given to you by your health care provider. Make sure you discuss any questions you have with your health care provider. Document Revised: 07/09/2018 Document Reviewed: 04/22/2016 Elsevier Patient Education  2020 [employer]. DOULA LIST   [employer] Doula  [place]  [phone]  [email or web address]  [email or web address]  [place] [employer] [phone]  [email or web address]   [employer]   Precious Danford Bad   [email or web address]   ??THE MOTHERLY DOULA?? Zola Button   [phone]   [email or web address]     [facility] [employer] Doula   Olive Bass  [phone]    [email or web address]   [employer] [facility]  Angie Rosier     [phone]     [email or web address]   Renato Gails: Doula & Photographer   Renato Gails [phone]       [email or web address]  [email or web address]   [employer] Doula [facility]  [place] [phone]   [email or web address]   [place]  Lolita Rieger  [phone]  [email or web address]   [email or web address]   [facility]   Kizzie Furnish   [phone]  [email or web address]   Dina Rich Marrero Doula  Dina Rich  [phone] [email or web address]  Natural [facility] Doulas  Cornelious Bryant         [facility]       Lora Reynolds     [phone] [email or web address]  [email or web address]  [facility]   [place] [phone] [email or web address]   Tamsen Meek Doula Services  Camelia Eng     [phone]  [email or web address]  [facility]     [phone]  [email or web address]  Facebook [email or web address] IG [email or web address] .  [phone] [email or web address] .[phone] [email or web address]   [phone]  [phone] [email or web address]     [facility]  [phone] [email or web address]   IG [email or web address]    [phone]    [phone] [email or web address]   Lurline Hare "[place]" Free  [phone] [email or web address]    Mtende Roll  [phone] [email or web address]   Susie Williams   [email or web address]    [phone]    [phone] [email or web address]     Denita Lung  [phone] [email or web address] .[email or web address] Galloway  [phone] [email or web address]    [place]    [phone]   [facility]   [phone] [email or web address]  Doula Mama [phone] [email or web address]     Considering Waterbirth? Guide for patients at [facility] Why consider waterbirth? . Gentle birth for babies  . Less pain medicine used in labor  . May allow for passive descent/less pushing  . May reduce perineal tears  . More mobility and instinctive maternal position changes  . Increased maternal relaxation  . Reduced blood pressure in labor   Is waterbirth safe? What are the risks of infection, drowning or other complications? . Infection:  [phone] Very low risk (3.7 % for tub vs 4.8% for bed)  . 7 in 8000 waterbirths with documented infection  . Poorly cleaned equipment most common cause  . Slightly lower group B strep transmission rate  . Drowning  . Maternal:  . Very low risk  . Related to seizures or fainting  . Newborn:  [employer] Very low risk. No evidence of increased risk of respiratory problems in multiple large studies  . Physiological protection from breathing under water  . Avoid underwater birth if there are any fetal complications  . Once baby's head is out of the water, keep it out.  . Birth complication  . Some reports of cord trauma, but risk decreased by bringing baby to surface gradually  . No evidence of increased risk of shoulder dystocia. Mothers can usually change positions faster in water than in a bed, possibly aiding the maneuvers to free the shoulder.  ? You must attend a Waterbirth class at [facility] at [facility] . 3rd Wednesday of every month from 7-9pm  . Free  . Register by calling [phone] or online at Saturday  . Bring [phone] the certificate from the class to your prenatal appointment  Meet with a midwife at 36 weeks to see if you can still plan a waterbirth and to sign the consent.   If you plan a waterbirth at [facility] and [facility], you can opt to purchase the following: . Fish Net . Bathing suit top (optional)  . Long-handled mirror (optional)  .  Things that would prevent you from having a waterbirth: . Unknown or Positive COVID-19 diagnosis upon admission to hospital  . Premature, <37wks  . Previous cesarean birth  . Presence of thick meconium-stained fluid  . Multiple gestation (Twins, triplets, etc.)  . Uncontrolled diabetes or gestational diabetes requiring medication  . Hypertension requiring medication or diagnosis of pre-eclampsia  . Heavy vaginal bleeding  . Non-reassuring fetal heart rate  . Active infection (MRSA, etc.). Group B Strep  is NOT a contraindication for waterbirth.  . If your labor has to be induced and induction method requires continuous monitoring of the baby's heart rate  . Other risks/issues identified by your obstetrical provider  Please remember that birth is unpredictable. Under certain unforeseeable circumstances your provider may advise against giving birth in the tub. These decisions will be made on a case-by-case basis and with the safety of you and your baby as our highest priority.  **Please remember that in order to have a waterbirth, you must test Negative to COVID-19 upon admission to the hospital.**

## 2019-10-05 NOTE — Progress Notes (Signed)
History:   Kelly Thompson is a 29 y.o. G1P0 at [redacted]w[redacted]d by LMP being seen today for her first obstetrical visit.  This is her first pregnancy, she has not experienced any complications so far. Patient does intend to breast feed. Pregnancy history fully reviewed.  Patient reports no complaints.      HISTORY: OB History  Gravida Para Term Preterm AB Living  1 0 0 0 0 0  SAB TAB Ectopic Multiple Live Births  0 0 0 0 0    # Outcome Date GA Lbr Len/2nd Weight Sex Delivery Anes PTL Lv  1 Current             Last pap smear was done 12/29/18 and was normal.  Past Medical History:  Diagnosis Date  . Asthma    allergy induced, environmental  . Irregular menstrual bleeding    Past Surgical History:  Procedure Laterality Date  . WISDOM TOOTH EXTRACTION     Family History  Problem Relation Age of Onset  . Diabetes Maternal Grandmother   . Heart disease Maternal Grandmother   . Thyroid disease Maternal Grandmother   . Heart attack Maternal Grandmother   . Hypertension Paternal Grandmother   . Glaucoma Paternal Grandmother   . Heart attack Paternal Grandmother    Social History   Tobacco Use  . Smoking status: Never Smoker  . Smokeless tobacco: Never Used  Vaping Use  . Vaping Use: Never used  Substance Use Topics  . Alcohol use: Yes    Comment: occ  . Drug use: No   No Known Allergies Current Outpatient Medications on File Prior to Visit  Medication Sig Dispense Refill  . Prenatal Vit-Fe Fumarate-FA (PRENATAL MULTIVITAMIN) TABS tablet Take 1 tablet by mouth daily at 12 noon.     No current facility-administered medications on file prior to visit.    Review of Systems Pertinent items noted in HPI and remainder of comprehensive ROS otherwise negative. Physical Exam:   Vitals:   10/05/19 0836  BP: 106/65  Pulse: 80  Temp: 98 F (36.7 C)  Weight: 173 lb 12.8 oz (78.8 kg)   Fetal Heart Rate (bpm): 140  Uterus:  Fundal Height: 25 cm  Pelvic Exam: Perineum: no  hemorrhoids, normal perineum   Vulva: normal external genitalia, no lesions   Vagina:  normal mucosa, normal discharge   Cervix: no lesions and normal, pap smear done.    Adnexa: normal adnexa and no mass, fullness, tenderness   Bony Pelvis: average  System: General: well-developed, well-nourished female in no acute distress   Breasts:  normal appearance, no masses or tenderness bilaterally   Skin: normal coloration and turgor, no rashes   Neurologic: oriented, normal, negative, normal mood   Extremities: normal strength, tone, and muscle mass, ROM of all joints is normal   HEENT PERRLA, extraocular movement intact and sclera clear, anicteric   Mouth/Teeth mucous membranes moist, pharynx normal without lesions and dental hygiene good   Neck supple and no masses   Cardiovascular: regular rate and rhythm   Respiratory:  no respiratory distress, normal breath sounds   Abdomen: soft, non-tender; bowel sounds normal; no masses,  no organomegaly    Assessment:    Pregnancy: G1P0 Patient Active Problem List   Diagnosis Date Noted  . Supervision of normal first pregnancy, antepartum 10/05/2019  . Educated about COVID-19 virus infection 09/14/2019  . Palpitations 09/14/2019     Plan:    1. Supervision of normal first pregnancy, antepartum - Pt  switched to [facility] for waterbirth and more flexibility on birth plans - Discussed requirements for waterbirth - Recommended birth doula care, pt interested in volunteer doula program - Blood Pressure Monitoring (BLOOD PRESSURE MONITOR AUTOMAT) DEVI; 1 Device by Does not apply route daily. Automatic blood pressure cuff regular size. To monitor blood pressure regularly at home. ICD-10 code: O50.90  Dispense: 1 each; Refill: 0  Continue prenatal vitamins. Problem list reviewed and updated. Genetic Screening discussed, Genetic screening performed at previous practice, all normal Ultrasound performed on 06/13/19 "normal appearing boy" with [facility]. Anticipatory guidance about prenatal visits given including labs, ultrasounds, and testing. Discussed usage of Babyscripts and virtual visits as additional source of managing and completing prenatal visits in midst of coronavirus and pandemic.   Encouraged to complete MyChart Registration for her ability to review results, send requests, and have questions addressed.  The nature of [facility] with multiple MDs and Advanced Practice Providers was explained to patient; also emphasized that residents, students are part of our team. Routine obstetric precautions reviewed. Encouraged to seek out care at office or emergency room [facility] MAU preferred) for urgent and/or emergent concerns. Return in about 3 weeks (around 10/26/2019) for LOB w GTT.     Edd Arbour, CNM [facility] [employer], [facility]

## 2019-10-12 ENCOUNTER — Ambulatory Visit (HOSPITAL_COMMUNITY): Payer: Medicaid Other | Attending: Cardiology

## 2019-10-12 ENCOUNTER — Other Ambulatory Visit: Payer: Self-pay

## 2019-10-12 DIAGNOSIS — R011 Cardiac murmur, unspecified: Secondary | ICD-10-CM | POA: Diagnosis present

## 2019-10-22 ENCOUNTER — Ambulatory Visit (HOSPITAL_COMMUNITY)
Admission: EM | Admit: 2019-10-22 | Discharge: 2019-10-22 | Disposition: A | Discharge status: Home / Self Care | Payer: Medicaid Other

## 2019-10-22 ENCOUNTER — Encounter (HOSPITAL_COMMUNITY): Payer: Self-pay | Admitting: Obstetrics & Gynecology

## 2019-10-22 ENCOUNTER — Other Ambulatory Visit: Payer: Self-pay

## 2019-10-22 ENCOUNTER — Inpatient Hospital Stay (HOSPITAL_COMMUNITY)
Admission: AD | Admit: 2019-10-22 | Discharge: 2019-10-22 | Disposition: A | Discharge status: Home / Self Care | Payer: Medicaid Other | Source: Ambulatory Visit | Attending: Obstetrics & Gynecology | Admitting: Obstetrics & Gynecology

## 2019-10-22 DIAGNOSIS — O99512 Diseases of the respiratory system complicating pregnancy, second trimester: Secondary | ICD-10-CM | POA: Diagnosis not present

## 2019-10-22 DIAGNOSIS — J45909 Unspecified asthma, uncomplicated: Secondary | ICD-10-CM | POA: Insufficient documentation

## 2019-10-22 DIAGNOSIS — O26899 Other specified pregnancy related conditions, unspecified trimester: Secondary | ICD-10-CM

## 2019-10-22 DIAGNOSIS — O26892 Other specified pregnancy related conditions, second trimester: Secondary | ICD-10-CM | POA: Insufficient documentation

## 2019-10-22 DIAGNOSIS — R109 Unspecified abdominal pain: Secondary | ICD-10-CM | POA: Insufficient documentation

## 2019-10-22 DIAGNOSIS — R102 Pelvic and perineal pain: Secondary | ICD-10-CM | POA: Diagnosis not present

## 2019-10-22 DIAGNOSIS — Z3A27 27 weeks gestation of pregnancy: Secondary | ICD-10-CM

## 2019-10-22 LAB — URINALYSIS, ROUTINE W REFLEX MICROSCOPIC
Bilirubin Urine: NEGATIVE
Glucose, UA: NEGATIVE mg/dL
Hgb urine dipstick: NEGATIVE
Ketones, ur: NEGATIVE mg/dL
Leukocytes,Ua: NEGATIVE
Nitrite: NEGATIVE
Protein, ur: NEGATIVE mg/dL
Specific Gravity, Urine: 1.008 (ref 1.005–1.030)
pH: 6 (ref 5.0–8.0)

## 2019-10-22 LAB — WET PREP, GENITAL
Clue Cells Wet Prep HPF POC: NONE SEEN
Sperm: NONE SEEN
Trich, Wet Prep: NONE SEEN
Yeast Wet Prep HPF POC: NONE SEEN

## 2019-10-22 NOTE — Discharge Instructions (Signed)

## 2019-10-22 NOTE — MAU Provider Note (Signed)
History     CSN: [id number]  Arrival date and time: 10/22/19 1740   First Provider Initiated Contact with Patient 10/22/19 1846      Chief Complaint  Patient presents with  . Pelvic Pain  . Abdominal Pain   HPI Kelly Thompson is a 29 y.o. G1P0 at [redacted]w[redacted]d who presents to MAU with chief complaint of lower abdominal pressure and pelvic pain. These are new problems, onset two weeks ago. She denies pain at rest but endorses pressure and pain in her pelvic when walking or changing positions. She denies vaginal bleeding, leaking of fluid, decreased fetal movement, fever, falls, or recent illness.   Patient receives care with [facility] and her next appointment is Monday 10/24/2019.   OB History    Gravida  1   Para      Term      Preterm      AB      Living  0     SAB      TAB      Ectopic      Multiple      Live Births              Past Medical History:  Diagnosis Date  . Asthma    allergy induced, environmental  . Irregular menstrual bleeding     Past Surgical History:  Procedure Laterality Date  . WISDOM TOOTH EXTRACTION      Family History  Problem Relation Age of Onset  . Diabetes Maternal Grandmother   . Heart disease Maternal Grandmother   . Thyroid disease Maternal Grandmother   . Heart attack Maternal Grandmother   . Hypertension Paternal Grandmother   . Glaucoma Paternal Grandmother   . Heart attack Paternal Grandmother     Social History   Tobacco Use  . Smoking status: Never Smoker  . Smokeless tobacco: Never Used  Vaping Use  . Vaping Use: Never used  Substance Use Topics  . Alcohol use: Not Currently    Comment: occ  . Drug use: No    Allergies: No Known Allergies  Medications Prior to Admission  Medication Sig Dispense Refill Last Dose  . Prenatal Vit-Fe Fumarate-FA (PRENATAL MULTIVITAMIN) TABS tablet Take 1 tablet by mouth daily at 12 noon.   10/22/2019 at Unknown time  . Blood Pressure Monitoring (BLOOD PRESSURE  MONITOR AUTOMAT) DEVI 1 Device by Does not apply route daily. Automatic blood pressure cuff regular size. To monitor blood pressure regularly at home. ICD-10 code: O09.90 1 each 0     Review of Systems  Gastrointestinal: Negative for abdominal pain.  Genitourinary: Positive for pelvic pain.  All other systems reviewed and are negative.  Physical Exam   Blood pressure 120/68, pulse 97, temperature 98.2 F (36.8 C), temperature source Oral, resp. rate 16, height 5\' 5"  (1.651 m), weight 81 kg, last menstrual period 04/19/2019, SpO2 99 %.  Physical Exam Vitals and nursing note reviewed. Exam conducted with a chaperone present.  Abdominal:     Palpations: Abdomen is soft.     Tenderness: There is no abdominal tenderness.     Comments: Gravid  Genitourinary:    Vagina: Normal.     Cervix: Normal.  Skin:    General: Skin is warm.     Capillary Refill: Capillary refill takes less than 2 seconds.  Neurological:     Mental Status: She is alert.     MAU Course  Procedures: speculum exam  --Cervix visually closed on speculum exam --Reactive  tracing: baseline 130, moderate variability, positive 10 x 10 accels, no decels --Toco: quiet --Discussed interventions for round ligament pain, pubic symphysis pain  Patient Vitals for the past 24 hrs:  BP Temp Temp src Pulse Resp SpO2 Height Weight  10/22/19 1855 123/72 -- -- (!) 106 -- -- -- --  10/22/19 1750 120/68 98.2 F (36.8 C) Oral 97 16 99 % 5\' 5"  (1.651 m) 81 kg   Results for orders placed or performed during the hospital encounter of 10/22/19 (from the past 24 hour(s))  Urinalysis, Routine w reflex microscopic     Status: Abnormal   Collection Time: 10/22/19  6:18 PM  Result Value Ref Range   Color, Urine STRAW (A) YELLOW   APPearance CLEAR CLEAR   Specific Gravity, Urine 1.008 1.005 - 1.030   pH 6.0 5.0 - 8.0   Glucose, UA NEGATIVE NEGATIVE mg/dL   Hgb urine dipstick NEGATIVE NEGATIVE   Bilirubin Urine NEGATIVE NEGATIVE    Ketones, ur NEGATIVE NEGATIVE mg/dL   Protein, ur NEGATIVE NEGATIVE mg/dL   Nitrite NEGATIVE NEGATIVE   Leukocytes,Ua NEGATIVE NEGATIVE  Wet prep, genital     Status: Abnormal   Collection Time: 10/22/19  6:18 PM   Specimen: Vaginal  Result Value Ref Range   Yeast Wet Prep HPF POC NONE SEEN NONE SEEN   Trich, Wet Prep NONE SEEN NONE SEEN   Clue Cells Wet Prep HPF POC NONE SEEN NONE SEEN   WBC, Wet Prep HPF POC MODERATE (A) NONE SEEN   Sperm NONE SEEN    Assessment and Plan  --29 y.o. G1P0 at [redacted]w[redacted]d  --Reactive tracing --Round ligament pain --Discharge home in stable condition  F/U: --[facility], next appointment this Monday 10/24/2019  10/26/2019, CNM 10/22/2019, 7:12 PM

## 2019-10-22 NOTE — MAU Note (Signed)
Pt reports to mau with c/o lower abd pressure that happens mostly when changing positions and moving.  Pt states pain has been on going for the past 2 weeks.  Pt denies vag dc, LOF, or bleeding at this time.  +fetal movement.  Reports some urinary urgency.

## 2019-10-22 NOTE — ED Notes (Signed)
Pt is 6 months pregnant with c/o pelvic pressure x approx 2 wks.  States spoke with OB and will be seen on Monday, but was told they didn't want her to wait that long.  Discussed with Nile Riggs, NP -- pt to go to MAU.  Pt verbalized understanding.

## 2019-10-24 ENCOUNTER — Ambulatory Visit: Payer: Medicaid Other

## 2019-10-24 LAB — GC/CHLAMYDIA PROBE AMP (~~LOC~~) NOT AT ARMC
Chlamydia: NEGATIVE
Comment: NEGATIVE
Comment: NORMAL
Neisseria Gonorrhea: NEGATIVE

## 2019-10-26 ENCOUNTER — Ambulatory Visit (INDEPENDENT_AMBULATORY_CARE_PROVIDER_SITE_OTHER): Payer: Medicaid Other | Admitting: Student

## 2019-10-26 ENCOUNTER — Other Ambulatory Visit: Payer: Self-pay

## 2019-10-26 ENCOUNTER — Encounter: Payer: Self-pay | Admitting: General Practice

## 2019-10-26 VITALS — BP 99/64 | HR 91 | Temp 98.1°F | Wt 179.6 lb

## 2019-10-26 DIAGNOSIS — O26899 Other specified pregnancy related conditions, unspecified trimester: Secondary | ICD-10-CM

## 2019-10-26 DIAGNOSIS — Z3400 Encounter for supervision of normal first pregnancy, unspecified trimester: Secondary | ICD-10-CM

## 2019-10-26 DIAGNOSIS — Z3A28 28 weeks gestation of pregnancy: Secondary | ICD-10-CM

## 2019-10-26 DIAGNOSIS — R102 Pelvic and perineal pain: Secondary | ICD-10-CM

## 2019-10-26 MED ORDER — COMFORT FIT MATERNITY SUPP SM MISC: 1.0000 [IU] | Freq: Every day | 0 refills | Status: DC | PRN

## 2019-10-26 NOTE — Patient Instructions (Signed)
WHERE TO GET YOUR PREGNANCY BELT: [employer] [phone] @[address] [place], [place] [address]  PREGNANCY SUPPORT BELT: You are not alone, Seventy-five percent of women have some sort of abdominal or back pain at some point in their pregnancy. Your baby is growing at a fast pace, which means that your whole body is rapidly trying to adjust to the changes. As your uterus grows, your back may start feeling a bit under stress and this can result in back or abdominal pain that can go from mild, and therefore bearable, to severe pains that will not allow you to sit or lay down comfortably, When it comes to dealing with pregnancy-related pains and cramps, some pregnant women usually prefer natural remedies, which the market is filled with nowadays. For example, wearing a pregnancy support belt can help ease and lessen your discomfort and pain.  WHAT ARE THE BENEFITS OF WEARING A PREGNANCY SUPPORT BELT? A pregnancy support belt provides support to the lower portion of the belly taking some of the weight of the growing uterus and distributing to the other parts of your body. It is designed make you comfortable and gives you extra support. Over the years, the pregnancy apparel market has been studying the needs and wants of pregnant women and they have come up with the most comfortable pregnancy support belts that woman could ever ask for. In fact, you will no longer have to wear a stretched-out or bulky pregnancy belt that is visible underneath your clothes and makes you feel even more uncomfortable. Nowadays, a pregnancy support belt is made of comfortable and stretchy materials that will not irritate your skin but will actually make you feel at ease and you will not even notice you are wearing it. They are easy to put on and adjust during the day and can be worn at night for additional support.   BENEFITS: . Relives Back pain . Relieves Abdominal Muscle and Leg Pain . Stabilizes the  Pelvic Ring . Offers a Cushioned Abdominal Lift Pad . Relieves pressure on the Sciatic Nerve Within Minutes    

## 2019-10-26 NOTE — Progress Notes (Signed)
   PRENATAL VISIT NOTE  Subjective:  Kelly Thompson is a 29 y.o. G1P0 at [redacted]w[redacted]d being seen today for ongoing prenatal care.  She is currently monitored for the following issues for this low-risk pregnancy and has Educated about COVID-19 virus infection; Palpitations; and Supervision of normal first pregnancy, antepartum on their problem list.  Patient reports no complaints.  Contractions: Not present. Vag. Bleeding: None.  Movement: Present. Denies leaking of fluid.   The following portions of the patient's history were reviewed and updated as appropriate: allergies, current medications, past family history, past medical history, past social history, past surgical history and problem list.   Objective:   Vitals:   10/26/19 0837  BP: (!) 99/64  Pulse: 91  Temp: 98.1 F (36.7 C)  Weight: 179 lb 9.6 oz (81.5 kg)    Fetal Status: Fetal Heart Rate (bpm): 159 Fundal Height: 28 cm Movement: Present     General:  Alert, oriented and cooperative. Patient is in no acute distress.  Skin: Skin is warm and dry. No rash noted.   Cardiovascular: Normal heart rate noted  Respiratory: Normal respiratory effort, no problems with respiration noted  Abdomen: Soft, gravid, appropriate for gestational age.  Pain/Pressure: Present     Pelvic: Cervical exam deferred        Extremities: Normal range of motion.  Edema: None  Mental Status: Normal mood and affect. Normal behavior. Normal judgment and thought content.   Assessment and Plan:  Pregnancy: G1P0 at [redacted]w[redacted]d 1. Supervision of normal first pregnancy, antepartum -Pt wants to get TDAP at next in person visit  - HIV Antibody (routine testing w rflx) - Glucose Tolerance, 2 Hours w/1 Hour - RPR - CBC  2. [redacted] weeks gestation of pregnancy   3. Pain of round ligament affecting pregnancy, antepartum  - Elastic Bandages & Supports (COMFORT FIT MATERNITY SUPP SM) MISC; 1 Units by Does not apply route daily as needed.  Dispense: 1 each; Refill:  0  Preterm labor symptoms and general obstetric precautions including but not limited to vaginal bleeding, contractions, leaking of fluid and fetal movement were reviewed in detail with the patient. Please refer to After Visit Summary for other counseling recommendations.   Return in about 2 weeks (around 11/09/2019) for Routine OB, virtual.  Future Appointments  Date Time Provider Department Center  11/09/2019  4:10 PM Raelyn Mora, CNM [facility] None  11/17/2019  3:45 PM Cleaver, Thomasene Ripple, NP [facility]    Judeth Horn, NP

## 2019-10-27 LAB — CBC
Hematocrit: 37.3 % (ref 34.0–46.6)
Hemoglobin: 12.1 g/dL (ref 11.1–15.9)
MCH: 27.8 pg (ref 26.6–33.0)
MCHC: 32.4 g/dL (ref 31.5–35.7)
MCV: 86 fL (ref 79–97)
Platelets: 221 x10E3/uL (ref 150–450)
RBC: 4.35 x10E6/uL (ref 3.77–5.28)
RDW: 13 % (ref 11.7–15.4)
WBC: 7.6 x10E3/uL (ref 3.4–10.8)

## 2019-10-27 LAB — HIV ANTIBODY (ROUTINE TESTING W REFLEX): HIV Screen 4th Generation wRfx: NONREACTIVE

## 2019-10-27 LAB — RPR: RPR Ser Ql: NONREACTIVE

## 2019-10-27 LAB — GLUCOSE TOLERANCE, 2 HOURS W/ 1HR
Glucose, 1 hour: 104 mg/dL (ref 65–179)
Glucose, 2 hour: 102 mg/dL (ref 65–152)
Glucose, Fasting: 70 mg/dL (ref 65–91)

## 2019-11-09 ENCOUNTER — Telehealth (INDEPENDENT_AMBULATORY_CARE_PROVIDER_SITE_OTHER): Payer: Medicaid Other | Admitting: Obstetrics and Gynecology

## 2019-11-09 DIAGNOSIS — Z3400 Encounter for supervision of normal first pregnancy, unspecified trimester: Secondary | ICD-10-CM

## 2019-11-11 ENCOUNTER — Encounter: Payer: Self-pay | Admitting: Obstetrics and Gynecology

## 2019-11-11 NOTE — Progress Notes (Signed)
° °  MY CHART VIDEO VIRTUAL OBSTETRICS VISIT ENCOUNTER NOTE  I connected with Kelly Thompson on 11/10/19 at  4:10 PM EDT by My Chart video at home and verified that I am speaking with the correct person using two identifiers. Provider located at [facility] for [employer] at [place].   I discussed the limitations, risks, security and privacy concerns of performing an evaluation and management service by My Chart video and the availability of in person appointments. I also discussed with the patient that there may be a patient responsible charge related to this service. The patient expressed understanding and agreed to proceed.  Subjective:  Kelly Thompson is a 29 y.o. G1P0 at [redacted]w[redacted]d being followed for ongoing prenatal care.  She is currently monitored for the following issues for this low-risk pregnancy and has Educated about COVID-19 virus infection; Palpitations; and Supervision of normal first pregnancy, antepartum on their problem list.  Patient reports no complaints. Reports fetal movement. Denies any contractions, bleeding or leaking of fluid.   The following portions of the patient's history were reviewed and updated as appropriate: allergies, current medications, past family history, past medical history, past social history, past surgical history and problem list.   Objective:   General:  Alert, oriented and cooperative.   Mental Status: Normal mood and affect perceived. Normal judgment and thought content.  Rest of physical exam deferred due to type of encounter  BP 121/65    Pulse 92    LMP 04/19/2019  **Done by patient's own at home BP cuff and scale  Assessment and Plan:  Pregnancy: G1P0 at [redacted]w[redacted]d  1. Supervision of normal first pregnancy, antepartum - Planning TdaP at nv  Preterm labor symptoms and general obstetric precautions including but not limited to vaginal bleeding, contractions, leaking of fluid and fetal movement were reviewed in detail with the patient.  I  discussed the assessment and treatment plan with the patient. The patient was provided an opportunity to ask questions and all were answered. The patient agreed with the plan and demonstrated an understanding of the instructions. The patient was advised to call back or seek an in-person office evaluation/go to MAU at [facility] for any urgent or concerning symptoms. Please refer to After Visit Summary for other counseling recommendations.   I provided 5 minutes of non-face-to-face time during this encounter. There was 5 minutes of chart review time spent prior to this encounter. Total time spent = 10 minutes.  Return in about 2 weeks (around 11/23/2019) for Return OB visit.  Future Appointments  Date Time Provider Department Center  11/17/2019  3:45 PM Ronney Asters, NP [facility]  11/18/2019 11:30 AM Sharyon Cable, CNM [facility] None    Raelyn Mora, CNM Center for [employer], [facility]

## 2019-11-17 ENCOUNTER — Ambulatory Visit: Payer: Medicaid Other | Admitting: General Practice

## 2019-11-18 ENCOUNTER — Telehealth: Payer: Medicaid Other | Admitting: Certified Nurse Midwife

## 2019-11-18 ENCOUNTER — Telehealth (INDEPENDENT_AMBULATORY_CARE_PROVIDER_SITE_OTHER): Payer: Medicaid Other | Admitting: Certified Nurse Midwife

## 2019-11-18 VITALS — Wt 179.0 lb

## 2019-11-18 DIAGNOSIS — R102 Pelvic and perineal pain unspecified side: Secondary | ICD-10-CM

## 2019-11-18 DIAGNOSIS — Z3400 Encounter for supervision of normal first pregnancy, unspecified trimester: Secondary | ICD-10-CM

## 2019-11-18 DIAGNOSIS — O26893 Other specified pregnancy related conditions, third trimester: Secondary | ICD-10-CM

## 2019-11-18 DIAGNOSIS — Z3A31 31 weeks gestation of pregnancy: Secondary | ICD-10-CM

## 2019-11-18 DIAGNOSIS — O26899 Other specified pregnancy related conditions, unspecified trimester: Secondary | ICD-10-CM

## 2019-11-18 NOTE — Patient Instructions (Signed)

## 2019-11-18 NOTE — Progress Notes (Signed)
OBSTETRICS PRENATAL VIRTUAL VISIT ENCOUNTER NOTE  Provider location: Center for [facility]   I connected with Kelly Thompson on 11/18/19 at 11:25 AM EDT by MyChart Video Encounter at home and verified that I am speaking with the correct person using two identifiers.   I discussed the limitations, risks, security and privacy concerns of performing an evaluation and management service virtually and the availability of in person appointments. I also discussed with the patient that there may be a patient responsible charge related to this service. The patient expressed understanding and agreed to proceed. Subjective:  Kelly Thompson is a 29 y.o. G1P0 at [redacted]w[redacted]d being seen today for ongoing prenatal care.  She is currently monitored for the following issues for this low-risk pregnancy and has Educated about COVID-19 virus infection; Palpitations; and Supervision of normal first pregnancy, antepartum on their problem list.  Patient reports pelvic pain.  Contractions: Not present. Vag. Bleeding: None.  Movement: Present. Denies any leaking of fluid.   The following portions of the patient's history were reviewed and updated as appropriate: allergies, current medications, past family history, past medical history, past social history, past surgical history and problem list.   Objective:   Vitals:   11/18/19 1128  Weight: 179 lb (81.2 kg)    Fetal Status:     Movement: Present     General:  Alert, oriented and cooperative. Patient is in no acute distress.  Respiratory: Normal respiratory effort, no problems with respiration noted  Mental Status: Normal mood and affect. Normal behavior. Normal judgment and thought content.  Rest of physical exam deferred due to type of encounter  Imaging: LONG TERM MONITOR (3-14 DAYS)  Result Date: 10/30/2019 NSR Sinus tachycardia. No significant arrhythmias Normal diurnal variation   Assessment and Plan:  Pregnancy: G1P0 at [redacted]w[redacted]d 1.  Supervision of normal first pregnancy, antepartum - Patient doing well, reports increased pelvic pressure and pain when she stands up and walks  - unable to take BP currently, at work and did not bring cuff to work. Encouraged to take BP when she gets home and send message in MyChart, patient verbalizes undersanding  - Routine prenatal care - Anticipatory guidance on upcoming appointments with next visit TDAP and discuss plans for birth  - Answered patient's travel. Discussed travel recommendations after 32 weeks   2. [redacted] weeks gestation of pregnancy  3. Pain of round ligament affecting pregnancy, antepartum - Educated and discussed RLP during pregnancy, discussed use of tylenol , heat and maternity support belt - Patient reports that she has prescription for belt but has been unable to pick it up at this time. Encouraged to pick up belt.    Preterm labor symptoms and general obstetric precautions including but not limited to vaginal bleeding, contractions, leaking of fluid and fetal movement were reviewed in detail with the patient. I discussed the assessment and treatment plan with the patient. The patient was provided an opportunity to ask questions and all were answered. The patient agreed with the plan and demonstrated an understanding of the instructions. The patient was advised to call back or seek an in-person office evaluation/go to [facility]U at [facility] for any urgent or concerning symptoms. Please refer to After Visit Summary for other counseling recommendations.   I provided 11 minutes of face-to-face time during this encounter.  Return in about 3 weeks (around 12/09/2019) for ROB/TDAP.  Future Appointments  Date Time Provider Department Center  12/07/2019  1:50 PM Judeth Horn, NP [facility]  None    Sharyon Cable, CNM Center for [employer], [facility]

## 2019-11-24 ENCOUNTER — Other Ambulatory Visit: Payer: Self-pay | Admitting: *Deleted

## 2019-11-24 ENCOUNTER — Encounter (HOSPITAL_COMMUNITY): Payer: Self-pay | Admitting: Obstetrics and Gynecology

## 2019-11-24 ENCOUNTER — Inpatient Hospital Stay (HOSPITAL_COMMUNITY)
Admission: AD | Admit: 2019-11-24 | Discharge: 2019-11-24 | Disposition: A | Discharge status: Home / Self Care | Payer: Medicaid Other | Attending: Obstetrics and Gynecology | Admitting: Obstetrics and Gynecology

## 2019-11-24 ENCOUNTER — Other Ambulatory Visit: Payer: Self-pay

## 2019-11-24 DIAGNOSIS — Z3689 Encounter for other specified antenatal screening: Secondary | ICD-10-CM | POA: Diagnosis not present

## 2019-11-24 DIAGNOSIS — R102 Pelvic and perineal pain: Secondary | ICD-10-CM | POA: Diagnosis not present

## 2019-11-24 DIAGNOSIS — O26893 Other specified pregnancy related conditions, third trimester: Secondary | ICD-10-CM | POA: Diagnosis not present

## 2019-11-24 DIAGNOSIS — O26899 Other specified pregnancy related conditions, unspecified trimester: Secondary | ICD-10-CM

## 2019-11-24 DIAGNOSIS — M549 Dorsalgia, unspecified: Secondary | ICD-10-CM | POA: Diagnosis not present

## 2019-11-24 DIAGNOSIS — Z3A32 32 weeks gestation of pregnancy: Secondary | ICD-10-CM

## 2019-11-24 DIAGNOSIS — Z3400 Encounter for supervision of normal first pregnancy, unspecified trimester: Secondary | ICD-10-CM

## 2019-11-24 LAB — URINALYSIS, ROUTINE W REFLEX MICROSCOPIC
Bilirubin Urine: NEGATIVE
Glucose, UA: NEGATIVE mg/dL
Hgb urine dipstick: NEGATIVE
Ketones, ur: NEGATIVE mg/dL
Leukocytes,Ua: NEGATIVE
Nitrite: NEGATIVE
Protein, ur: NEGATIVE mg/dL
Specific Gravity, Urine: 1.004 — ABNORMAL LOW (ref 1.005–1.030)
pH: 8 (ref 5.0–8.0)

## 2019-11-24 MED ORDER — ACETAMINOPHEN 500 MG PO TABS
1000.0000 mg | ORAL_TABLET | Freq: Once | ORAL | Status: AC
  Administered 2019-11-24: 1000 mg via ORAL
  Filled 2019-11-24: qty 2

## 2019-11-24 MED ORDER — CYCLOBENZAPRINE HCL 10 MG PO TABS
10.0000 mg | ORAL_TABLET | Freq: Two times a day (BID) | ORAL | 0 refills | Status: DC | PRN
Start: 2019-11-24 — End: 2020-01-26

## 2019-11-24 MED ORDER — CYCLOBENZAPRINE HCL 5 MG PO TABS
10.0000 mg | ORAL_TABLET | Freq: Once | ORAL | Status: AC
  Administered 2019-11-24: 10 mg via ORAL
  Filled 2019-11-24: qty 2

## 2019-11-24 NOTE — MAU Provider Note (Signed)
History     CSN: [id number]  Arrival date and time: 11/24/19 1035   First Provider Initiated Contact with Patient 11/24/19 1155      Chief Complaint  Patient presents with   Back Pain   HPI Kelly Thompson is a 29 y.o. G1P0 at [redacted]w[redacted]d who presents to MAU with chief complaint of sharp pain in her pelvis. This is a recurrent problem. Patient states she feels 2-3 episodes of sharp pain per day when walking or repositioning. She previously attempted management with a maternity belt but did not find that helpful. She has also tried intermittent Tylenol and floating in a pool with moderate relief. She denies contraction pain, vaginal bleeding, leaking of fluid, decreased fetal movement, fever, falls, or recent illness.   She receives care with [facility].  OB History    Gravida  1   Para      Term      Preterm      AB      Living  0     SAB      TAB      Ectopic      Multiple      Live Births              Past Medical History:  Diagnosis Date   Asthma    allergy induced, environmental   Irregular menstrual bleeding     Past Surgical History:  Procedure Laterality Date   WISDOM TOOTH EXTRACTION      Family History  Problem Relation Age of Onset   Diabetes Maternal Grandmother    Heart disease Maternal Grandmother    Thyroid disease Maternal Grandmother    Heart attack Maternal Grandmother    Hypertension Paternal Grandmother    Glaucoma Paternal Grandmother    Heart attack Paternal Grandmother     Social History   Tobacco Use   Smoking status: Never Smoker   Smokeless tobacco: Never Used  Building services engineer Use: Never used  Substance Use Topics   Alcohol use: Not Currently    Comment: occ   Drug use: No    Allergies: No Known Allergies  Medications Prior to Admission  Medication Sig Dispense Refill Last Dose   Blood Pressure Monitoring (BLOOD PRESSURE MONITOR AUTOMAT) DEVI 1 Device by Does not apply route daily.  Automatic blood pressure cuff regular size. To monitor blood pressure regularly at home. ICD-10 code: O70.90 (Patient not taking: Reported on 10/26/2019) 1 each 0    Elastic Bandages & Supports (COMFORT FIT MATERNITY SUPP SM) MISC 1 Units by Does not apply route daily as needed. 1 each 0    Prenatal Vit-Fe Fumarate-FA (PRENATAL MULTIVITAMIN) TABS tablet Take 1 tablet by mouth daily at 12 noon.       Review of Systems  Gastrointestinal: Negative for abdominal pain.  Genitourinary: Positive for pelvic pain.  All other systems reviewed and are negative.  Physical Exam   Blood pressure 127/84, pulse (!) 113, temperature 97.9 F (36.6 C), temperature source Oral, resp. rate 16, height 5\' 5"  (1.651 m), weight 81.6 kg, last menstrual period 04/19/2019, SpO2 100 %.  Physical Exam Vitals and nursing note reviewed. Exam conducted with a chaperone present.  Cardiovascular:     Rate and Rhythm: Tachycardia present.     Pulses: Normal pulses.     Heart sounds: Normal heart sounds.  Pulmonary:     Effort: Pulmonary effort is normal.     Breath sounds: Normal breath sounds.  Abdominal:  Comments: Gravid  Skin:    Capillary Refill: Capillary refill takes less than 2 seconds.  Neurological:     General: No focal deficit present.  Psychiatric:        Mood and Affect: Mood normal.     MAU Course  Procedures  --Closed cervix --Prolonged monitoring x 1 hour. Reactive tracing, baseline 140, mod var, + 15 x 15 accels, no decels --Toco: quiet --Marginal improvement s/p Tylenol and Flexeril  Patient Vitals for the past 24 hrs:  BP Temp Temp src Pulse Resp SpO2 Height Weight  11/24/19 1207 127/71 -- -- 100 15 100 % -- --  11/24/19 1101 127/84 -- -- (!) 113 -- -- -- --  11/24/19 1054 119/74 97.9 F (36.6 C) Oral 87 16 100 % 5\' 5"  (1.651 m) 81.6 kg   Results for orders placed or performed during the hospital encounter of 11/24/19 (from the past 24 hour(s))  Urinalysis, Routine w reflex  microscopic Urine, Clean Catch     Status: Abnormal   Collection Time: 11/24/19 10:48 AM  Result Value Ref Range   Color, Urine STRAW (A) YELLOW   APPearance CLEAR CLEAR   Specific Gravity, Urine 1.004 (L) 1.005 - 1.030   pH 8.0 5.0 - 8.0   Glucose, UA NEGATIVE NEGATIVE mg/dL   Hgb urine dipstick NEGATIVE NEGATIVE   Bilirubin Urine NEGATIVE NEGATIVE   Ketones, ur NEGATIVE NEGATIVE mg/dL   Protein, ur NEGATIVE NEGATIVE mg/dL   Nitrite NEGATIVE NEGATIVE   Leukocytes,Ua NEGATIVE NEGATIVE   Meds ordered this encounter  Medications   acetaminophen (TYLENOL) tablet 1,000 mg   cyclobenzaprine (FLEXERIL) tablet 10 mg   cyclobenzaprine (FLEXERIL) 10 MG tablet    Sig: Take 1 tablet (10 mg total) by mouth 2 (two) times daily as needed for muscle spasms.    Dispense:  20 tablet    Refill:  0    Order Specific Question:   Supervising Provider    Answer:   11/26/19 Conan Bowens   Assessment and Plan  --29 y.o. G1P0 at [redacted]w[redacted]d  --Reactive tracing, closed cervix --Round ligament pain, pubic symphysis pain --Continue using maternity pillow at night and at rest --New rx Flexeril, reconsider daily use of maternity belt, wear low on hips to assist with pelvic pain --New rx Amb Referral to Physical Therapy --Discharge home in stable condition  F/U --Next appt [facility] is 12/07/2019  02/06/2020, CNM 11/24/2019, 2:02 PM

## 2019-11-24 NOTE — Discharge Instructions (Signed)

## 2019-11-24 NOTE — MAU Note (Signed)
.   Kelly Thompson is a 29 y.o. at [redacted]w[redacted]d here in MAU reporting: Back pain that has lasted x2 weeks. She states that on Monday it started to get worse and last night the pain brought her to tears. Movement makes it worse. No VB or LOF. Denies ctx. Endorses good fetal movement.   Pain score: 4 Vitals:   11/24/19 1054  BP: 119/74  Pulse: 87  Resp: 16  Temp: 97.9 F (36.6 C)  SpO2: 100%     FHT:131 Lab orders placed from triage: UA

## 2019-12-07 ENCOUNTER — Other Ambulatory Visit: Payer: Self-pay

## 2019-12-07 ENCOUNTER — Ambulatory Visit (INDEPENDENT_AMBULATORY_CARE_PROVIDER_SITE_OTHER): Payer: Medicaid Other | Admitting: Student

## 2019-12-07 VITALS — BP 114/75 | HR 88 | Wt 186.8 lb

## 2019-12-07 DIAGNOSIS — Z3400 Encounter for supervision of normal first pregnancy, unspecified trimester: Secondary | ICD-10-CM

## 2019-12-07 DIAGNOSIS — G479 Sleep disorder, unspecified: Secondary | ICD-10-CM

## 2019-12-07 DIAGNOSIS — Z3A34 34 weeks gestation of pregnancy: Secondary | ICD-10-CM

## 2019-12-07 NOTE — Patient Instructions (Signed)

## 2019-12-07 NOTE — Progress Notes (Signed)
   PRENATAL VISIT NOTE  Subjective:  Kelly Thompson is a 29 y.o. G1P0 at [redacted]w[redacted]d being seen today for ongoing prenatal care.  She is currently monitored for the following issues for this low-risk pregnancy and has Educated about COVID-19 virus infection; Palpitations; and Supervision of normal first pregnancy, antepartum on their problem list.  Patient reports sleeping issues. States she has trouble falling asleep.When she does fall asleep she wakes up frequently due to needing to urinate and discomfort. She has not tried treatment for her symptoms. She does use severeal pillows as support during sleep. .  Contractions: Not present. Vag. Bleeding: None.  Movement: Present. Denies leaking of fluid.   The following portions of the patient's history were reviewed and updated as appropriate: allergies, current medications, past family history, past medical history, past social history, past surgical history and problem list.   Objective:   Vitals:   12/07/19 1404  BP: 114/75  Pulse: 88  Weight: 186 lb 12.8 oz (84.7 kg)    Fetal Status: Fetal Heart Rate (bpm): 136 Fundal Height: 34 cm Movement: Present  Presentation: Vertex  General:  Alert, oriented and cooperative. Patient is in no acute distress.  Skin: Skin is warm and dry. No rash noted.   Cardiovascular: Normal heart rate noted  Respiratory: Normal respiratory effort, no problems with respiration noted  Abdomen: Soft, gravid, appropriate for gestational age.  Pain/Pressure: Present     Pelvic: Cervical exam deferred        Extremities: Normal range of motion.  Edema: None  Mental Status: Normal mood and affect. Normal behavior. Normal judgment and thought content.   Assessment and Plan:  Pregnancy: G1P0 at [redacted]w[redacted]d 1. Supervision of normal first pregnancy, antepartum -declined TDAP -The patient was counseled on the potential benefits and lack of known risks of COVID vaccination, during pregnancy and breastfeeding, on today's visit.The  patient is not planning to get vaccinated at this time.   -previously seen in MAU for pelvic pain. Was referred for PT. States she was unable to set up an appointment but her symptoms have improved and she doesn't need it at this time. Will contact us if symptoms worsen again.   2. [redacted] weeks gestation of pregnancy   3. Sleep disturbance -Discussed lifestyle changes - no fluid intake near bedtime, avoid caffeine later in the day, no screen time close to bed time -Discussed melatonin vs magnesium supplements. Pt would like to try melatonin. Use nightly per bottle instructions & we will reassess at next ob visit  Preterm labor symptoms and general obstetric precautions including but not limited to vaginal bleeding, contractions, leaking of fluid and fetal movement were reviewed in detail with the patient. Please refer to After Visit Summary for other counseling recommendations.   Return in about 2 weeks (around 12/21/2019) for Routine OB, in person.  Future Appointments  Date Time Provider Department Center  12/21/2019  8:30 AM Raelyn Mora, CNM [facility] None    Judeth Horn, NP

## 2019-12-07 NOTE — Progress Notes (Signed)
Pt. Presents for ROB, [redacted]w[redacted]d. Pt. States she is having trouble sleeping and would like to discuss medication option to help with that.

## 2019-12-21 ENCOUNTER — Encounter: Payer: Self-pay | Admitting: General Practice

## 2019-12-21 ENCOUNTER — Other Ambulatory Visit: Payer: Self-pay

## 2019-12-21 ENCOUNTER — Other Ambulatory Visit (HOSPITAL_COMMUNITY)
Admission: RE | Admit: 2019-12-21 | Discharge: 2019-12-21 | Disposition: A | Discharge status: Home / Self Care | Payer: Medicaid Other | Source: Ambulatory Visit | Attending: Obstetrics and Gynecology | Admitting: Obstetrics and Gynecology

## 2019-12-21 ENCOUNTER — Ambulatory Visit (INDEPENDENT_AMBULATORY_CARE_PROVIDER_SITE_OTHER): Payer: Medicaid Other | Admitting: Obstetrics and Gynecology

## 2019-12-21 VITALS — BP 103/67 | HR 84 | Temp 97.7°F | Wt 187.0 lb

## 2019-12-21 DIAGNOSIS — Z3400 Encounter for supervision of normal first pregnancy, unspecified trimester: Secondary | ICD-10-CM

## 2019-12-21 DIAGNOSIS — Z3A36 36 weeks gestation of pregnancy: Secondary | ICD-10-CM

## 2019-12-21 DIAGNOSIS — G479 Sleep disorder, unspecified: Secondary | ICD-10-CM

## 2019-12-21 NOTE — Progress Notes (Signed)
   LOW-RISK PREGNANCY OFFICE VISIT Patient name: Kelly Thompson MRN [id number]  Date of birth: 05-03-1990 Chief Complaint:   Routine Prenatal Visit  History of Present Illness:   Kelly Thompson is a 29 y.o. G1P0 female at [redacted]w[redacted]d with an Estimated Date of Delivery: 01/17/20 being seen today for ongoing management of a low-risk pregnancy.  Today she reports occasional contractions. She is still having problems with sleeping and cramp is in lower extremities. She is planning a Systems developer. She has attended the class; copy of certificate in scanned documents. Contractions: Irregular. Vag. Bleeding: None.  Movement: Present. denies leaking of fluid. Review of Systems:   Pertinent items are noted in HPI Denies abnormal vaginal discharge w/ itching/odor/irritation, headaches, visual changes, shortness of breath, chest pain, abdominal pain, severe nausea/vomiting, or problems with urination or bowel movements unless otherwise stated above. Pertinent History Reviewed:  Reviewed past medical,surgical, social, obstetrical and family history.  Reviewed problem list, medications and allergies. Physical Assessment:   Vitals:   12/21/19 0901  BP: 103/67  Pulse: 84  Temp: 97.7 F (36.5 C)  Weight: 187 lb (84.8 kg)  Body mass index is 31.12 kg/m.        Physical Examination:   General appearance: Well appearing, and in no distress  Mental status: Alert, oriented to person, place, and time  Skin: Warm & dry  Cardiovascular: Normal heart rate noted  Respiratory: Normal respiratory effort, no distress  Abdomen: Soft, gravid, nontender  Pelvic: Cervical exam performed  Dilation: Fingertip Effacement (%): 50 Station: Ballotable  Extremities: Edema: None  Fetal Status: Fetal Heart Rate (bpm): 137 Fundal Height: 36 cm Movement: Present Presentation: Vertex  No results found for this or any previous visit (from the past 24 hour(s)).  Assessment & Plan:  1) Low-risk pregnancy G1P0 at [redacted]w[redacted]d with an Estimated  Date of Delivery: 01/17/20   2) Supervision of normal first pregnancy, antepartum  - Culture, beta strep (group b only),  - Cervicovaginal ancillary only ([place]) - Discussed natural labor plans and waterbirth expectations; open to receiving pain medication if she cannot take the pain - Waterbirth certificate in scanned documents  3) Sleep disturbance - Patient has not bought Melatonin - Advised to get Magnesium for sleep and leg cramps  4) [redacted] weeks gestation of pregnancy    Meds: No orders of the defined types were placed in this encounter.  Labs/procedures today: cervical exam  Plan:  Continue routine obstetrical care   Reviewed: Preterm labor symptoms and general obstetric precautions including but not limited to vaginal bleeding, contractions, leaking of fluid and fetal movement were reviewed in detail with the patient.  All questions were answered. Has home bp cuff. Check bp weekly, let us know if >140/90.   Follow-up: Return in about 2 weeks (around 01/04/2020) for Return OB visit.  Orders Placed This Encounter  Procedures  . Culture, beta strep (group b only)   Raelyn Mora MSN, CNM 12/21/2019 9:36 AM

## 2019-12-21 NOTE — Patient Instructions (Signed)

## 2019-12-22 LAB — CERVICOVAGINAL ANCILLARY ONLY
Bacterial Vaginitis (gardnerella): NEGATIVE
Candida Glabrata: NEGATIVE
Candida Vaginitis: NEGATIVE
Chlamydia: NEGATIVE
Comment: NEGATIVE
Comment: NEGATIVE
Comment: NEGATIVE
Comment: NEGATIVE
Comment: NEGATIVE
Comment: NORMAL
Neisseria Gonorrhea: NEGATIVE
Trichomonas: NEGATIVE

## 2019-12-25 LAB — CULTURE, BETA STREP (GROUP B ONLY): Strep Gp B Culture: NEGATIVE

## 2019-12-27 ENCOUNTER — Encounter: Payer: Self-pay | Admitting: General Practice

## 2019-12-28 ENCOUNTER — Other Ambulatory Visit: Payer: Self-pay

## 2019-12-28 ENCOUNTER — Ambulatory Visit (INDEPENDENT_AMBULATORY_CARE_PROVIDER_SITE_OTHER): Payer: Medicaid Other

## 2019-12-28 VITALS — BP 104/63 | HR 91 | Temp 97.5°F | Wt 189.0 lb

## 2019-12-28 DIAGNOSIS — Z3400 Encounter for supervision of normal first pregnancy, unspecified trimester: Secondary | ICD-10-CM

## 2019-12-28 NOTE — Progress Notes (Signed)
   PRENATAL VISIT NOTE  Subjective:  Kelly Thompson is a 29 y.o. G1P0 at [redacted]w[redacted]d being seen today for ongoing prenatal care.  She is currently monitored for the following issues for this low-risk pregnancy and has Educated about COVID-19 virus infection; Palpitations; and Supervision of normal first pregnancy, antepartum on their problem list.  Patient reports no complaints.  Contractions: Irregular. Vag. Bleeding: None.  Movement: Present. Denies leaking of fluid.   The following portions of the patient's history were reviewed and updated as appropriate: allergies, current medications, past family history, past medical history, past social history, past surgical history and problem list.   Objective:   Vitals:   12/28/19 1004  BP: 104/63  Pulse: 91  Temp: (!) 97.5 F (36.4 C)  Weight: 189 lb (85.7 kg)    Fetal Status: Fetal Heart Rate (bpm): 133 Fundal Height: 37 cm Movement: Present     General:  Alert, oriented and cooperative. Patient is in no acute distress.  Skin: Skin is warm and dry. No rash noted.   Cardiovascular: Normal heart rate noted  Respiratory: Normal respiratory effort, no problems with respiration noted  Abdomen: Soft, gravid, appropriate for gestational age.  Pain/Pressure: Present     Pelvic: Cervical exam deferred        Extremities: Normal range of motion.  Edema: None  Mental Status: Normal mood and affect. Normal behavior. Normal judgment and thought content.   Assessment and Plan:  Pregnancy: G1P0 at [redacted]w[redacted]d 1. Supervision of normal first pregnancy, antepartum -No complaints, routine care -Labor precautions reviewed  Term labor symptoms and general obstetric precautions including but not limited to vaginal bleeding, contractions, leaking of fluid and fetal movement were reviewed in detail with the patient. Please refer to After Visit Summary for other counseling recommendations.   Return in about 1 week (around 01/04/2020) for Return OB visit.  Future  Appointments  Date Time Provider Department Center  01/04/2020 10:50 AM Sharyon Cable, CNM [facility] None  01/11/2020 11:10 AM Bernerd Limbo, CNM [facility] None    Rolm Bookbinder, [place]

## 2020-01-04 ENCOUNTER — Ambulatory Visit (INDEPENDENT_AMBULATORY_CARE_PROVIDER_SITE_OTHER): Payer: Medicaid Other | Admitting: Certified Nurse Midwife

## 2020-01-04 ENCOUNTER — Other Ambulatory Visit: Payer: Self-pay

## 2020-01-04 ENCOUNTER — Encounter: Payer: Self-pay | Admitting: Certified Nurse Midwife

## 2020-01-04 VITALS — BP 115/74 | HR 100 | Temp 97.4°F | Wt 188.4 lb

## 2020-01-04 DIAGNOSIS — Z3400 Encounter for supervision of normal first pregnancy, unspecified trimester: Secondary | ICD-10-CM

## 2020-01-04 DIAGNOSIS — Z3A38 38 weeks gestation of pregnancy: Secondary | ICD-10-CM

## 2020-01-04 NOTE — Patient Instructions (Signed)
Reasons to go to MAU:  1.  Contractions are  5 minutes apart or less, each last 1 minute, these have been going on for 1-2 hours, and you cannot walk or talk during them 2.  You have a large gush of fluid, or a trickle of fluid that will not stop and you have to wear a pad 3.  You have bleeding that is bright red, heavier than spotting--like menstrual bleeding (spotting can be normal in early labor or after a check of your cervix) 4.  You do not feel the baby moving like he/she normally does   Cervical Ripening (to get your cervix ready for labor) : May try one or all:  Red Raspberry Leaf capsules:  two 300mg or 400mg tablets with each meal, 2-3 times a day  Potential Side Effects Of Raspberry Leaf:  Most women do not experience any side effects from drinking raspberry leaf tea. However, nausea and loose stools are possible   Evening Primrose Oil capsules: may take 1 to 3 capsules daily. May also prick one to release the oil and insert it into your vagina at night.  Some of the potential side effects:  Upset stomach  Loose stools or diarrhea  Headaches  Nausea   4 Dates a day (may taste better if warmed in microwave until soft). Found where raisins are in the grocery store  

## 2020-01-04 NOTE — Progress Notes (Signed)
   PRENATAL VISIT NOTE  Subjective:  Kelly Thompson is a 29 y.o. G1P0 at [redacted]w[redacted]d being seen today for ongoing prenatal care.  She is currently monitored for the following issues for this low-risk pregnancy and has Educated about COVID-19 virus infection; Palpitations; and Supervision of normal first pregnancy, antepartum on their problem list.  Patient reports occasional contractions.  Contractions: Irregular. Vag. Bleeding: None.  Movement: Present. Denies leaking of fluid.   The following portions of the patient's history were reviewed and updated as appropriate: allergies, current medications, past family history, past medical history, past social history, past surgical history and problem list.   Objective:   Vitals:   01/04/20 1105  BP: 115/74  Pulse: 100  Temp: (!) 97.4 F (36.3 C)  Weight: 188 lb 6.4 oz (85.5 kg)    Fetal Status: Fetal Heart Rate (bpm): 132 Fundal Height: 35 cm Movement: Present     General:  Alert, oriented and cooperative. Patient is in no acute distress.  Skin: Skin is warm and dry. No rash noted.   Cardiovascular: Normal heart rate noted  Respiratory: Normal respiratory effort, no problems with respiration noted  Abdomen: Soft, gravid, appropriate for gestational age.  Pain/Pressure: Present     Pelvic: Cervical exam deferred        Extremities: Normal range of motion.  Edema: None  Mental Status: Normal mood and affect. Normal behavior. Normal judgment and thought content.   Assessment and Plan:  Pregnancy: G1P0 at [redacted]w[redacted]d 1. Supervision of normal first pregnancy, antepartum - Patient doing well, reports occasional irregular contractions  - Patient reports use of RRT at home, encouraged use of EPO in addition for cervical ripening.  - Labor precautions and reasons to present to MAU  - Routine prenatal care - Anticipatory guidance on upcoming appointments  - Educated and discussed waterbirth  2. [redacted] weeks gestation of pregnancy  Term labor symptoms and  general obstetric precautions including but not limited to vaginal bleeding, contractions, leaking of fluid and fetal movement were reviewed in detail with the patient. Please refer to After Visit Summary for other counseling recommendations.   Return in about 1 week (around 01/11/2020) for LROB.  Future Appointments  Date Time Provider Department Center  01/11/2020 11:10 AM Bernerd Limbo, CNM [facility] None    Sharyon Cable, [place]

## 2020-01-09 ENCOUNTER — Telehealth: Payer: Self-pay | Admitting: *Deleted

## 2020-01-09 DIAGNOSIS — Z3400 Encounter for supervision of normal first pregnancy, unspecified trimester: Secondary | ICD-10-CM

## 2020-01-09 MED ORDER — MEDELA DOUBLE BREAST PUMP MISC: 1.0000 | 0 refills | Status: DC

## 2020-01-09 MED ORDER — AMEDA FINESSE BREAST PUMP MISC: 1.0000 | 0 refills | Status: DC

## 2020-01-09 NOTE — Telephone Encounter (Signed)
Patient called requesting a prescription for a breast pump. Patient reported that her insurance would cover the cost. Patient was not sure if the insurance would cover the Medela or Ameda Finesse. Prescription for both printed. Patient will pick up prescriptions at up coming appointment on Wednesday 01/11/2020.  Clovis Pu, RN

## 2020-01-11 ENCOUNTER — Other Ambulatory Visit: Payer: Self-pay | Admitting: Certified Nurse Midwife

## 2020-01-11 ENCOUNTER — Ambulatory Visit (INDEPENDENT_AMBULATORY_CARE_PROVIDER_SITE_OTHER): Payer: Medicaid Other | Admitting: Certified Nurse Midwife

## 2020-01-11 ENCOUNTER — Other Ambulatory Visit: Payer: Self-pay

## 2020-01-11 VITALS — BP 104/61 | HR 111 | Temp 98.1°F | Wt 191.2 lb

## 2020-01-11 DIAGNOSIS — Z3403 Encounter for supervision of normal first pregnancy, third trimester: Secondary | ICD-10-CM

## 2020-01-11 DIAGNOSIS — Z3A39 39 weeks gestation of pregnancy: Secondary | ICD-10-CM

## 2020-01-11 NOTE — Patient Instructions (Signed)
Braxton Hicks Contractions °Contractions of the uterus can occur throughout pregnancy, but they are not always a sign that you are in labor. You may have practice contractions called Braxton Hicks contractions. These false labor contractions are sometimes confused with true labor. °What are Braxton Hicks contractions? °Braxton Hicks contractions are tightening movements that occur in the muscles of the uterus before labor. Unlike true labor contractions, these contractions do not result in opening (dilation) and thinning of the cervix. Toward the end of pregnancy (32-34 weeks), Braxton Hicks contractions can happen more often and may become stronger. These contractions are sometimes difficult to tell apart from true labor because they can be very uncomfortable. You should not feel embarrassed if you go to the hospital with false labor. °Sometimes, the only way to tell if you are in true labor is for your health care provider to look for changes in the cervix. The health care provider will do a physical exam and may monitor your contractions. If you are not in true labor, the exam should show that your cervix is not dilating and your water has not broken. °If there are no other health problems associated with your pregnancy, it is completely safe for you to be sent home with false labor. You may continue to have Braxton Hicks contractions until you go into true labor. °How to tell the difference between true labor and false labor °True labor °· Contractions last 30-70 seconds. °· Contractions become very regular. °· Discomfort is usually felt in the top of the uterus, and it spreads to the lower abdomen and low back. °· Contractions do not go away with walking. °· Contractions usually become more intense and increase in frequency. °· The cervix dilates and gets thinner. °False labor °· Contractions are usually shorter and not as strong as true labor contractions. °· Contractions are usually irregular. °· Contractions  are often felt in the front of the lower abdomen and in the groin. °· Contractions may go away when you walk around or change positions while lying down. °· Contractions get weaker and are shorter-lasting as time goes on. °· The cervix usually does not dilate or become thin. °Follow these instructions at home: ° °· Take over-the-counter and prescription medicines only as told by your health care provider. °· Keep up with your usual exercises and follow other instructions from your health care provider. °· Eat and drink lightly if you think you are going into labor. °· If Braxton Hicks contractions are making you uncomfortable: °? Change your position from lying down or resting to walking, or change from walking to resting. °? Sit and rest in a tub of warm water. °? Drink enough fluid to keep your urine pale yellow. Dehydration may cause these contractions. °? Do slow and deep breathing several times an hour. °· Keep all follow-up prenatal visits as told by your health care provider. This is important. °Contact a health care provider if: °· You have a fever. °· You have continuous pain in your abdomen. °Get help right away if: °· Your contractions become stronger, more regular, and closer together. °· You have fluid leaking or gushing from your vagina. °· You pass blood-tinged mucus (bloody show). °· You have bleeding from your vagina. °· You have low back pain that you never had before. °· You feel your baby’s head pushing down and causing pelvic pressure. °· Your baby is not moving inside you as much as it used to. °Summary °· Contractions that occur before labor are   called Braxton Hicks contractions, false labor, or practice contractions. °· Braxton Hicks contractions are usually shorter, weaker, farther apart, and less regular than true labor contractions. True labor contractions usually become progressively stronger and regular, and they become more frequent. °· Manage discomfort from Braxton Hicks contractions  by changing position, resting in a warm bath, drinking plenty of water, or practicing deep breathing. °This information is not intended to replace advice given to you by your health care provider. Make sure you discuss any questions you have with your health care provider. °Document Revised: 02/27/2017 Document Reviewed: 07/31/2016 °Elsevier Patient Education © 2020 Elsevier Inc. ° °

## 2020-01-11 NOTE — Progress Notes (Signed)
   PRENATAL VISIT NOTE  Subjective:  Kelly Thompson is a 29 y.o. G1P0 at [redacted]w[redacted]d being seen today for ongoing prenatal care.  She is currently monitored for the following issues for this low-risk pregnancy and has Educated about COVID-19 virus infection; Palpitations; and Supervision of normal first pregnancy, antepartum on their problem list.  Patient reports no complaints.  Contractions: Irritability. Vag. Bleeding: None.  Movement: Present. Denies leaking of fluid.   The following portions of the patient's history were reviewed and updated as appropriate: allergies, current medications, past family history, past medical history, past social history, past surgical history and problem list.   Objective:   Vitals:   01/11/20 1127  BP: 104/61  Pulse: (!) 111  Temp: 98.1 F (36.7 C)  Weight: 191 lb 3.2 oz (86.7 kg)    Fetal Status: Fetal Heart Rate (bpm): 135 Fundal Height: 39 cm Movement: Present  Presentation: Vertex  General:  Alert, oriented and cooperative. Patient is in no acute distress.  Skin: Skin is warm and dry. No rash noted.   Cardiovascular: Normal heart rate noted  Respiratory: Normal respiratory effort, no problems with respiration noted  Abdomen: Soft, gravid, appropriate for gestational age.  Pain/Pressure: Present     Pelvic: Cervical exam performed in the presence of a chaperone Dilation: Closed Effacement (%): 50 Station: Ballotable  Extremities: Normal range of motion.  Edema: None  Mental Status: Normal mood and affect. Normal behavior. Normal judgment and thought content.   Assessment and Plan:  Pregnancy: G1P0 at [redacted]w[redacted]d 1. Encounter for supervision of normal first pregnancy in third trimester - Pt doing well, only having occasional Braxton-Hicks contractions, not regular enough to time  2. [redacted] weeks gestation of pregnancy - Discussed IOL at 41wks, pt amenable to scheduling it today (scheduled and orders in) - Reviewed timing of contractions and expected  progression of end-of-pregnancy into early labor  Term labor symptoms and general obstetric precautions including but not limited to vaginal bleeding, contractions, leaking of fluid and fetal movement were reviewed in detail with the patient. Please refer to After Visit Summary for other counseling recommendations.   Return in about 1 week (around 01/18/2020) for LOB w NST.  Future Appointments  Date Time Provider Department Center  01/24/2020  8:25 AM [facility] SCHED ROOM [facility] None    Edd Arbour, CNM, MSN, [facility] 01/11/20 11:56 AM

## 2020-01-12 ENCOUNTER — Encounter (HOSPITAL_COMMUNITY): Payer: Self-pay | Admitting: *Deleted

## 2020-01-12 ENCOUNTER — Telehealth (HOSPITAL_COMMUNITY): Payer: Self-pay | Admitting: *Deleted

## 2020-01-12 NOTE — Telephone Encounter (Signed)
Preadmission screen  

## 2020-01-16 ENCOUNTER — Other Ambulatory Visit (HOSPITAL_COMMUNITY): Payer: Self-pay | Admitting: Advanced Practice Midwife

## 2020-01-18 ENCOUNTER — Ambulatory Visit (INDEPENDENT_AMBULATORY_CARE_PROVIDER_SITE_OTHER): Payer: Medicaid Other | Admitting: Certified Nurse Midwife

## 2020-01-18 ENCOUNTER — Other Ambulatory Visit: Payer: Self-pay

## 2020-01-18 ENCOUNTER — Encounter: Payer: Self-pay | Admitting: General Practice

## 2020-01-18 VITALS — BP 114/75 | HR 67 | Temp 98.1°F | Wt 194.0 lb

## 2020-01-18 DIAGNOSIS — Z3A40 40 weeks gestation of pregnancy: Secondary | ICD-10-CM

## 2020-01-18 DIAGNOSIS — Z3400 Encounter for supervision of normal first pregnancy, unspecified trimester: Secondary | ICD-10-CM

## 2020-01-18 NOTE — Patient Instructions (Signed)
Kelly [employer]    Reasons to go to MAU:  1.  Contractions are  5 minutes apart or less, each last 1 minute, these have been going on for 1-2 hours, and you cannot walk or talk during them 2.  You have a large gush of fluid, or a trickle of fluid that will not stop and you have to wear a pad 3.  You have bleeding that is bright red, heavier than spotting--like menstrual bleeding (spotting can be normal in early labor or after a check of your cervix) 4.  You do not feel the baby moving like he/she normally does

## 2020-01-18 NOTE — Progress Notes (Signed)
   PRENATAL VISIT NOTE  Subjective:  Kelly Thompson is a 29 y.o. G1P0 at [redacted]w[redacted]d being seen today for ongoing prenatal care.  She is currently monitored for the following issues for this low-risk pregnancy and has Educated about COVID-19 virus infection; Palpitations; and Supervision of normal first pregnancy, antepartum on their problem list.  Patient reports occasional contractions.  Contractions: Irregular. Vag. Bleeding: None.  Movement: Present. Denies leaking of fluid.   The following portions of the patient's history were reviewed and updated as appropriate: allergies, current medications, past family history, past medical history, past social history, past surgical history and problem list.   Objective:   Vitals:   01/18/20 1438  BP: 114/75  Pulse: 67  Temp: 98.1 F (36.7 C)  Weight: 194 lb (88 kg)    Fetal Status: Fetal Heart Rate (bpm): 140 Fundal Height: 39 cm Movement: Present  Presentation: Vertex  General:  Alert, oriented and cooperative. Patient is in no acute distress.  Skin: Skin is warm and dry. No rash noted.   Cardiovascular: Normal heart rate noted  Respiratory: Normal respiratory effort, no problems with respiration noted  Abdomen: Soft, gravid, appropriate for gestational age.  Pain/Pressure: Present     Pelvic: Cervical exam performed in the presence of a chaperone Dilation: Closed Effacement (%): 50 Station: Ballotable  Extremities: Normal range of motion.  Edema: None  Mental Status: Normal mood and affect. Normal behavior. Normal judgment and thought content.   Assessment and Plan:  Pregnancy: G1P0 at [redacted]w[redacted]d 1. Supervision of normal first pregnancy, antepartum - Patient doing well, reports occasional contractions  - Post dates antenatal screening performed today  - Discussed with patient labor precautions and reasons to go to MAU - Continue using EPO and RRT, include [place] circuit   2. [redacted] weeks gestation of pregnancy - NST reactive and reassuring today,  baseline 130/moderate/+accels/ no decelerations  - UI with irregular UC   Term labor symptoms and general obstetric precautions including but not limited to vaginal bleeding, contractions, leaking of fluid and fetal movement were reviewed in detail with the patient. Please refer to After Visit Summary for other counseling recommendations.   Return in about 5 days (around 01/23/2020) for ROB, NST.  Future Appointments  Date Time Provider Department Center  01/23/2020  9:30 AM [facility] None  01/24/2020  8:25 AM [facility] SCHED ROOM [facility] None    Sharyon Cable, CNM

## 2020-01-20 ENCOUNTER — Other Ambulatory Visit (HOSPITAL_COMMUNITY): Payer: Self-pay | Admitting: Advanced Practice Midwife

## 2020-01-21 ENCOUNTER — Encounter (HOSPITAL_COMMUNITY): Payer: Self-pay | Admitting: Family Medicine

## 2020-01-21 ENCOUNTER — Inpatient Hospital Stay (HOSPITAL_COMMUNITY): Admission: AD | Admit: 2020-01-21 | Payer: Self-pay | Source: Home / Self Care | Admitting: Family Medicine

## 2020-01-21 ENCOUNTER — Other Ambulatory Visit: Payer: Self-pay

## 2020-01-21 ENCOUNTER — Inpatient Hospital Stay (HOSPITAL_COMMUNITY)
Admission: AD | Admit: 2020-01-21 | Discharge: 2020-01-21 | Disposition: A | Discharge status: Home / Self Care | Payer: Medicaid Other | Attending: Family Medicine | Admitting: Family Medicine

## 2020-01-21 DIAGNOSIS — O471 False labor at or after 37 completed weeks of gestation: Secondary | ICD-10-CM | POA: Insufficient documentation

## 2020-01-21 DIAGNOSIS — O479 False labor, unspecified: Secondary | ICD-10-CM

## 2020-01-21 DIAGNOSIS — O480 Post-term pregnancy: Secondary | ICD-10-CM | POA: Diagnosis not present

## 2020-01-21 DIAGNOSIS — Z3A40 40 weeks gestation of pregnancy: Secondary | ICD-10-CM | POA: Insufficient documentation

## 2020-01-21 NOTE — Discharge Instructions (Signed)
Things to Try After 37 weeks to Encourage Labor/Get Ready for Labor:   1.  Try the [employer] at [email or web address].[email or web address] daily to improve baby's position and encourage the onset of labor.  2. Walk a little and rest a little every day.  Change positions often.  3. Cervical Ripening: May try one or both a. Red Raspberry Leaf capsules or tea:  two 300mg  or 400mg  tablets with each meal, 2-3 times a day, or 1-3 cups of tea daily  Potential Side Effects Of Raspberry Leaf:  Most women do not experience any side effects from drinking raspberry leaf tea. However, nausea and loose stools are possible   b. Evening Primrose Oil capsules: may take 1 to 3 capsules daily. Take 1-2 capsules by mouth each day and place one capsule vaginally at night.  You may also prick the vaginal capsule to release the oil prior to inserting in the vagina. Some of the potential side effects:  Upset stomach  Loose stools or diarrhea  Headaches  Nausea  4. Sex (and especially sex with orgasm) can also help the cervix ripen and encourage labor onset.   Reasons to return to MAU at [facility]:  1.  Contractions are  5 minutes apart or less, each last 1 minute, these have been going on for 1-2 hours, and you cannot walk or talk during them 2.  You have a large gush of fluid, or a trickle of fluid that will not stop and you have to wear a pad 3.  You have bleeding that is bright red, heavier than spotting--like menstrual bleeding (spotting can be normal in early labor or after a check of your cervix) 4.  You do not feel the baby moving like he/she normally does   Fetal Movement Counts Patient Name: ________________________________________________ Patient Due Date: ____________________ What is a fetal movement count?  A fetal movement count is the number of times that you feel your baby move during a certain amount of time. This may also be called a fetal kick count. A fetal movement count is  recommended for every pregnant woman. You may be asked to start counting fetal movements as early as week 28 of your pregnancy. Pay attention to when your baby is most active. You may notice your baby's sleep and wake cycles. You may also notice things that make your baby move more. You should do a fetal movement count:  When your baby is normally most active.  At the same time each day. A good time to count movements is while you are resting, after having something to eat and drink. How do I count fetal movements? 1. Find a quiet, comfortable area. Sit, or lie down on your side. 2. Write down the date, the start time and stop time, and the number of movements that you felt between those two times. Take this information with you to your health care visits. 3. Write down your start time when you feel the first movement. 4. Count kicks, flutters, swishes, rolls, and jabs. You should feel at least 10 movements. 5. You may stop counting after you have felt 10 movements, or if you have been counting for 2 hours. Write down the stop time. 6. If you do not feel 10 movements in 2 hours, contact your health care provider for further instructions. Your health care provider may want to do additional tests to assess your baby's well-being. Contact a health care provider if:  You feel fewer than 10  movements in 2 hours.  Your baby is not moving like he or she usually does. Date: ____________ Start time: ____________ Stop time: ____________ Movements: ____________ Date: ____________ Start time: ____________ Stop time: ____________ Movements: ____________ Date: ____________ Start time: ____________ Stop time: ____________ Movements: ____________ Date: ____________ Start time: ____________ Stop time: ____________ Movements: ____________ Date: ____________ Start time: ____________ Stop time: ____________ Movements: ____________ Date: ____________ Start time: ____________ Stop time: ____________ Movements:  ____________ Date: ____________ Start time: ____________ Stop time: ____________ Movements: ____________ Date: ____________ Start time: ____________ Stop time: ____________ Movements: ____________ Date: ____________ Start time: ____________ Stop time: ____________ Movements: ____________ This information is not intended to replace advice given to you by your health care provider. Make sure you discuss any questions you have with your health care provider. Document Revised: 11/04/2018 Document Reviewed: 11/04/2018 Elsevier Patient Education  2020 Elsevier Inc[employer] of the uterus can occur throughout pregnancy, but they are not always a sign that you are in labor. You may have practice contractions called Braxton Hicks contractions. These false labor contractions are sometimes confused with true labor. What are Deberah Pelton contractions? Braxton Hicks contractions are tightening movements that occur in the muscles of the uterus before labor. Unlike true labor contractions, these contractions do not result in opening (dilation) and thinning of the cervix. Toward the end of pregnancy (32-34 weeks), Braxton Hicks contractions can happen more often and may become stronger. These contractions are sometimes difficult to tell apart from true labor because they can be very uncomfortable. You should not feel embarrassed if you go to the hospital with false labor. Sometimes, the only way to tell if you are in true labor is for your health care provider to look for changes in the cervix. The health care provider will do a physical exam and may monitor your contractions. If you are not in true labor, the exam should show that your cervix is not dilating and your water has not broken. If there are no other health problems associated with your pregnancy, it is completely safe for you to be sent home with false labor. You may continue to have Braxton Hicks contractions until you go  into true labor. How to tell the difference between true labor and false labor True labor  Contractions last 30-70 seconds.  Contractions become very regular.  Discomfort is usually felt in the top of the uterus, and it spreads to the lower abdomen and low back.  Contractions do not go away with walking.  Contractions usually become more intense and increase in frequency.  The cervix dilates and gets thinner. False labor  Contractions are usually shorter and not as strong as true labor contractions.  Contractions are usually irregular.  Contractions are often felt in the front of the lower abdomen and in the groin.  Contractions may go away when you walk around or change positions while lying down.  Contractions get weaker and are shorter-lasting as time goes on.  The cervix usually does not dilate or become thin. Follow these instructions at home:   Take over-the-counter and prescription medicines only as told by your health care provider.  Keep up with your usual exercises and follow other instructions from your health care provider.  Eat and drink lightly if you think you are going into labor.  If Braxton Hicks contractions are making you uncomfortable: ? Change your position from lying down or resting to walking, or change from walking to resting. ? Sit and rest in  a tub of warm water. ? Drink enough fluid to keep your urine pale yellow. Dehydration may cause these contractions. ? Do slow and deep breathing several times an hour.  Keep all follow-up prenatal visits as told by your health care provider. This is important. Contact a health care provider if:  You have a fever.  You have continuous pain in your abdomen. Get help right away if:  Your contractions become stronger, more regular, and closer together.  You have fluid leaking or gushing from your vagina.  You pass blood-tinged mucus (bloody show).  You have bleeding from your vagina.  You have low  back pain that you never had before.  You feel your baby's head pushing down and causing pelvic pressure.  Your baby is not moving inside you as much as it used to. Summary  Contractions that occur before labor are called Braxton Hicks contractions, false labor, or practice contractions.  Braxton Hicks contractions are usually shorter, weaker, farther apart, and less regular than true labor contractions. True labor contractions usually become progressively stronger and regular, and they become more frequent.  Manage discomfort from [facility] contractions by changing position, resting in a warm bath, drinking plenty of water, or practicing deep breathing. This information is not intended to replace advice given to you by your health care provider. Make sure you discuss any questions you have with your health care provider. Document Revised: 02/27/2017 Document Reviewed: 07/31/2016 Elsevier Patient Education  2020 [employer].

## 2020-01-21 NOTE — MAU Provider Note (Addendum)
S: Ms. Abeeha Twist is a 29 y.o. G1P0 at [redacted]w[redacted]d  who presents to MAU today for labor evaluation.   Cervical exam by RN:  Dilation: 1 Effacement (%): 60 Station: -2 Presentation: Vertex Exam by:: Karl Ito, rnc   Fetal Monitoring: Baseline: 150 Variability: mod Accelerations: yes, reactive Decelerations: no Contractions: some  MDM Discussed patient with RN. NST reviewed & Reactive strip.  A: [facility] at [redacted]w[redacted]d  False labor; pt strongly desires discharge home given plan for waterbirth and desire to engage in early labor at home.  P: Discharge home.  Labor precautions and kick counts included in AVS Patient to follow-up with 10/26 for post-dates IOL as scheduled  Patient may return to MAU as needed or when in labor   Jesusita Oka, MD 01/21/2020 2:08 AM  I reviewed this pt's NST and agree with resident's note as above.  Sheila Oats, MD Center for [facility], [facility] 01/21/2020 2:16 AM

## 2020-01-21 NOTE — Progress Notes (Signed)
I have communicated with Sharen Counter, cnm and reviewed vital signs:  Vitals:   01/21/20 0144 01/21/20 0200  BP: 118/80 125/83  Pulse: 91 97  Resp: 17   Temp: 98.5 F (36.9 C)     Vaginal exam:  Dilation: 1 Effacement (%): 60 Station: -2 Presentation: Vertex Exam by:: Karl Ito, rnc ,   Also reviewed contraction pattern and that non-stress test is reactive.  It has been documented that patient is contracting every 5-6 minutes, pt 1cm and wants a water birth.  Pt not in active labor. Patient denies any other complaints.  Based on this report provider has given order for discharge.  A discharge order and diagnosis entered by a provider.   Labor discharge instructions reviewed with patient.

## 2020-01-21 NOTE — MAU Provider Note (Signed)
Pt discharged, see MAU note.  Pt interested in waterbirth, desires to labor before her postdates induction scheduled 01/24/20. Offered to sweep membranes prior to d/c today. Pt declined, has appt at [facility] on 10/25 and will discuss with her provider then.  Labor precautions reviewed, pt to try the [employer] daily, return to MAU with signs of active labor.

## 2020-01-21 NOTE — MAU Note (Signed)
UC all day yesterday, now every X 2 hours.  denies bleeding, endorses positive FM

## 2020-01-23 ENCOUNTER — Encounter (HOSPITAL_COMMUNITY): Payer: Self-pay | Admitting: Obstetrics and Gynecology

## 2020-01-23 ENCOUNTER — Inpatient Hospital Stay (HOSPITAL_COMMUNITY)
Admission: AD | Admit: 2020-01-23 | Discharge: 2020-01-26 | DRG: 807 | Disposition: A | Discharge status: Home / Self Care | Payer: Medicaid Other | Attending: Obstetrics & Gynecology | Admitting: Obstetrics & Gynecology

## 2020-01-23 ENCOUNTER — Other Ambulatory Visit: Payer: Self-pay

## 2020-01-23 ENCOUNTER — Other Ambulatory Visit (HOSPITAL_COMMUNITY): Payer: Medicaid Other

## 2020-01-23 ENCOUNTER — Inpatient Hospital Stay (HOSPITAL_COMMUNITY): Payer: Medicaid Other | Admitting: Anesthesiology

## 2020-01-23 DIAGNOSIS — O4212 Full-term premature rupture of membranes, onset of labor more than 24 hours following rupture: Secondary | ICD-10-CM | POA: Diagnosis not present

## 2020-01-23 DIAGNOSIS — Z20822 Contact with and (suspected) exposure to covid-19: Secondary | ICD-10-CM | POA: Diagnosis present

## 2020-01-23 DIAGNOSIS — Z3A40 40 weeks gestation of pregnancy: Secondary | ICD-10-CM

## 2020-01-23 DIAGNOSIS — O411230 Chorioamnionitis, third trimester, not applicable or unspecified: Secondary | ICD-10-CM | POA: Diagnosis present

## 2020-01-23 DIAGNOSIS — O26893 Other specified pregnancy related conditions, third trimester: Secondary | ICD-10-CM | POA: Diagnosis present

## 2020-01-23 DIAGNOSIS — Z3400 Encounter for supervision of normal first pregnancy, unspecified trimester: Secondary | ICD-10-CM

## 2020-01-23 LAB — TYPE AND SCREEN
ABO/RH(D): B POS
Antibody Screen: NEGATIVE

## 2020-01-23 LAB — CBC
HCT: 40.6 % (ref 36.0–46.0)
HCT: 40 % (ref 36.0–46.0)
Hemoglobin: 13.2 g/dL (ref 12.0–15.0)
Hemoglobin: 13.1 g/dL (ref 12.0–15.0)
MCH: 26.8 pg (ref 26.0–34.0)
MCH: 27.3 pg (ref 26.0–34.0)
MCHC: 32.5 g/dL (ref 30.0–36.0)
MCHC: 32.8 g/dL (ref 30.0–36.0)
MCV: 82.5 fL (ref 80.0–100.0)
MCV: 83.5 fL (ref 80.0–100.0)
Platelets: 175 10*3/uL (ref 150–400)
Platelets: 176 10*3/uL (ref 150–400)
RBC: 4.92 MIL/uL (ref 3.87–5.11)
RBC: 4.79 MIL/uL (ref 3.87–5.11)
RDW: 14.1 % (ref 11.5–15.5)
RDW: 14.3 % (ref 11.5–15.5)
WBC: 10.6 10*3/uL — ABNORMAL HIGH (ref 4.0–10.5)
WBC: 13 10*3/uL — ABNORMAL HIGH (ref 4.0–10.5)
nRBC: 0 % (ref 0.0–0.2)
nRBC: 0 % (ref 0.0–0.2)

## 2020-01-23 LAB — RESPIRATORY PANEL BY RT PCR (FLU A&B, COVID)
Influenza A by PCR: NEGATIVE
Influenza B by PCR: NEGATIVE
SARS Coronavirus 2 by RT PCR: NEGATIVE

## 2020-01-23 LAB — RPR: RPR Ser Ql: NONREACTIVE

## 2020-01-23 MED ORDER — SOD CITRATE-CITRIC ACID 500-334 MG/5ML PO SOLN: 30.0000 mL | ORAL | Status: DC | PRN

## 2020-01-23 MED ORDER — ACETAMINOPHEN 325 MG PO TABS
650.0000 mg | ORAL_TABLET | ORAL | Status: DC | PRN
  Administered 2020-01-23: 650 mg via ORAL
  Filled 2020-01-23: qty 2

## 2020-01-23 MED ORDER — LIDOCAINE HCL (PF) 1 % IJ SOLN: 30.0000 mL | INTRAMUSCULAR | Status: DC | PRN

## 2020-01-23 MED ORDER — FENTANYL-BUPIVACAINE-NACL 0.5-0.125-0.9 MG/250ML-% EP SOLN
12.0000 mL/h | EPIDURAL | Status: DC | PRN
  Filled 2020-01-23: qty 250

## 2020-01-23 MED ORDER — EPHEDRINE 5 MG/ML INJ: 10.0000 mg | INTRAVENOUS | Status: DC | PRN

## 2020-01-23 MED ORDER — OXYTOCIN-SODIUM CHLORIDE 30-0.9 UT/500ML-% IV SOLN: 1.0000 m[IU]/min | INTRAVENOUS | Status: DC

## 2020-01-23 MED ORDER — PHENYLEPHRINE 40 MCG/ML (10ML) SYRINGE FOR IV PUSH (FOR BLOOD PRESSURE SUPPORT): 80.0000 ug | PREFILLED_SYRINGE | INTRAVENOUS | Status: DC | PRN

## 2020-01-23 MED ORDER — SODIUM CHLORIDE (PF) 0.9 % IJ SOLN
INTRAMUSCULAR | Status: DC | PRN
Start: 2020-01-23 — End: 2020-01-24
  Administered 2020-01-23: 12 mL/h via EPIDURAL

## 2020-01-23 MED ORDER — LACTATED RINGERS IV BOLUS
500.0000 mL | Freq: Once | INTRAVENOUS | Status: AC
  Administered 2020-01-23: 500 mL via INTRAVENOUS

## 2020-01-23 MED ORDER — TERBUTALINE SULFATE 1 MG/ML IJ SOLN: 0.2500 mg | Freq: Once | INTRAMUSCULAR | Status: DC | PRN

## 2020-01-23 MED ORDER — DIPHENHYDRAMINE HCL 50 MG/ML IJ SOLN: 12.5000 mg | INTRAMUSCULAR | Status: DC | PRN

## 2020-01-23 MED ORDER — LACTATED RINGERS IV SOLN: INTRAVENOUS | Status: DC

## 2020-01-23 MED ORDER — LACTATED RINGERS IV SOLN: 500.0000 mL | Freq: Once | INTRAVENOUS | Status: DC

## 2020-01-23 MED ORDER — FENTANYL CITRATE (PF) 100 MCG/2ML IJ SOLN
100.0000 ug | INTRAMUSCULAR | Status: DC | PRN
  Administered 2020-01-23: 100 ug via INTRAVENOUS
  Filled 2020-01-23: qty 2

## 2020-01-23 MED ORDER — LIDOCAINE-EPINEPHRINE (PF) 2 %-1:200000 IJ SOLN
INTRAMUSCULAR | Status: DC | PRN
  Administered 2020-01-23: 5 mL via EPIDURAL

## 2020-01-23 MED ORDER — OXYTOCIN-SODIUM CHLORIDE 30-0.9 UT/500ML-% IV SOLN
INTRAVENOUS | Status: AC
  Administered 2020-01-23: 2 m[IU]/min via INTRAVENOUS
  Filled 2020-01-23: qty 500

## 2020-01-23 NOTE — Progress Notes (Signed)
Patient doing well, continues to be comfortable with epidural, reports intermittent pressure in bottom with contractions.   Vitals:   01/23/20 2130 01/23/20 2200 01/23/20 2230 01/23/20 2300  BP: 118/80 118/76 122/79 110/67  Pulse: 94 88 88 91  Resp: 18 20 18 18   Temp:   98.4 F (36.9 C)   TempSrc:   Axillary   SpO2:      Weight:       Currently on 62milli-unit/min of pitocin  IUPC placed and pitocin increased to 12  Dilation: 6 Effacement (%): 80 Cervical Position: Middle Station: -1 Presentation: Vertex Exam by:: [email or web address], CNM  Continue to titrate pitocin to active labor. Continue to perform position changes as baby is suspected to be OP currently.  Plan for SVD Cat I tracing   Steward Drone, CNM 01/23/20, 11:44 PM

## 2020-01-23 NOTE — H&P (Signed)
LABOR AND DELIVERY ADMISSION HISTORY AND PHYSICAL NOTE  Kelly Thompson is a 29 y.o. female G1P0 with IUP at [redacted]w[redacted]d by early [place] presenting for SOL. She reports positive fetal movement. She denies leakage of fluid or vaginal bleeding.  Prenatal History/Complications: PNC at [facility]  Pregnancy complications:  - Palpitations   Past Medical History: Past Medical History:  Diagnosis Date  . Asthma    allergy induced, environmental  . Irregular menstrual bleeding     Past Surgical History: Past Surgical History:  Procedure Laterality Date  . WISDOM TOOTH EXTRACTION      Obstetrical History: OB History    Gravida  1   Para      Term      Preterm      AB      Living  0     SAB      TAB      Ectopic      Multiple      Live Births              Social History: Social History   Socioeconomic History  . Marital status: Single    Spouse name: Not on file  . Number of children: Not on file  . Years of education: Not on file  . Highest education level: Bachelor's degree (e.g., BA, AB, BS)  Occupational History  . Not on file  Tobacco Use  . Smoking status: Never Smoker  . Smokeless tobacco: Never Used  Vaping Use  . Vaping Use: Never used  Substance and Sexual Activity  . Alcohol use: Not Currently    Comment: occ  . Drug use: No  . Sexual activity: Yes    Birth control/protection: None  Other Topics Concern  . Not on file  Social History Narrative   Lives with boyfriend.   Works in a lab.     Social Determinants of Health   Financial Resource Strain:   . Difficulty of Paying Living Expenses: Not on file  Food Insecurity:   . Worried About Programme researcher, broadcasting/film/video in the Last Year: Not on file  . Ran Out of Food in the Last Year: Not on file  Transportation Needs:   . Lack of Transportation (Medical): Not on file  . Lack of Transportation (Non-Medical): Not on file  Physical Activity:   . Days of Exercise per Week: Not on file  . Minutes of Exercise  per Session: Not on file  Stress:   . Feeling of Stress : Not on file  Social Connections:   . Frequency of Communication with Friends and Family: Not on file  . Frequency of Social Gatherings with Friends and Family: Not on file  . Attends Religious Services: Not on file  . Active Member of Clubs or Organizations: Not on file  . Attends Banker Meetings: Not on file  . Marital Status: Not on file    Family History: Family History  Problem Relation Age of Onset  . Diabetes Maternal Grandmother   . Heart disease Maternal Grandmother   . Thyroid disease Maternal Grandmother   . Heart attack Maternal Grandmother   . Hypertension Paternal Grandmother   . Glaucoma Paternal Grandmother   . Heart attack Paternal Grandmother     Allergies: No Known Allergies  Medications Prior to Admission  Medication Sig Dispense Refill Last Dose  . Prenatal Vit-Fe Fumarate-FA (PRENATAL MULTIVITAMIN) TABS tablet Take 1 tablet by mouth daily at 12 noon.   Past Week  at Unknown time  . Azelaic Acid 15 % cream Apply topically 2 (two) times daily.     . Blood Pressure Monitoring (BLOOD PRESSURE MONITOR AUTOMAT) DEVI 1 Device by Does not apply route daily. Automatic blood pressure cuff regular size. To monitor blood pressure regularly at home. ICD-10 code: O09.90 1 each 0   . cyclobenzaprine (FLEXERIL) 10 MG tablet Take 1 tablet (10 mg total) by mouth 2 (two) times daily as needed for muscle spasms. (Patient not taking: Reported on 12/07/2019) 20 tablet 0   . Dapsone 5 % topical gel Apply topically 2 (two) times daily.     Clinical research associate Bandages & Supports (COMFORT FIT MATERNITY SUPP SM) MISC 1 Units by Does not apply route daily as needed. 1 each 0   . Misc. Devices (AMEDA FINESSE BREAST PUMP) MISC 1 Device by Does not apply route as directed. 1 each 0   . Misc. Devices (MEDELA DOUBLE BREAST PUMP) MISC 1 Device by Does not apply route as directed. 1 each 0      Review of Systems  All systems  reviewed and negative except as stated in HPI  Physical Exam Blood pressure 121/79, pulse 92, temperature 98.3 F (36.8 C), temperature source Oral, resp. rate 18, weight 87.9 kg, last menstrual period 04/19/2019, SpO2 100 %. General appearance: alert, cooperative and no distress Lungs: clear to auscultation bilaterally Heart: regular rate and rhythm Abdomen: soft, non-tender; bowel sounds normal Extremities: No calf swelling or tenderness Presentation: cephalic NST- Fetal monitoring: 130/moderate/+accels/ no decelerations  Uterine activity: 4 minutes/moderate by palpation  Dilation: 5 Effacement (%): 100 Station: -1 Exam by:: Lanice Shirts CNM  Prenatal labs: ABO, Rh: --/--/PENDING (10/25 0320) Antibody: PENDING (10/25 0320) Rubella: Immune (03/15 0000) RPR: Non Reactive (07/28 0843)  HBsAg: Negative (03/15 0000)  HIV: Non Reactive (07/28 0843)  GC/Chlamydia: negative GBS: Negative/-- (09/22 0930)  2hr Glucola: [phone] Genetic screening:  negative Anatomy US: normal female    Associate Professor  [facility] Dating   early ultrasound  Language  English Anatomy US   normal  Flu Vaccine  Declined Genetic Screen   normal, done at [facility]    TDaP vaccine   declined Hgb A1C or  GTT Third trimester  Glucose, Fasting 65 - 91 mg/dL 70   Glucose, 1 hour 65 - 179 mg/dL 353   Glucose, 2 hour 65 - 152 mg/dL 614     Rhogam  N/A   LAB RESULTS     Blood Type B/Positive/-- (03/15 0000)   Feeding Plan Breast Antibody Negative (03/15 0000)  Contraception Pill? Rubella Immune (03/15 0000)  Circumcision Yes RPR Nonreactive (03/15 0000)   Pediatrician  Undecided HBsAg Negative (03/15 0000)   Support Person FOB HCVAb Negative  Prenatal Classes Info given HIV Non-reactive, Non-reactive (03/15 0000)     BTL Consent N/A GBS  NEGATIVE (For PCN allergy, check sensitivities)   VBAC Consent N/A Pap 12/29/2018, NIL    Hgb Electro    BP Cuff Rx [facility] 10/05/19 CF    Weight Scale Has at home SMA     Prenatal Transfer Tool  Maternal Diabetes: No Genetic Screening: Normal Maternal Ultrasounds/Referrals: Normal Fetal Ultrasounds or other Referrals:  None Maternal Substance Abuse:  No Significant Maternal Medications:  None Significant Maternal Lab Results: Group B Strep negative  Results for orders placed or performed during the hospital encounter of 01/23/20 (from the past 24 hour(s))  Respiratory Panel by RT PCR (Flu A&B, Covid) -  Nasopharyngeal Swab   Collection Time: 01/23/20  1:38 AM   Specimen: Nasopharyngeal Swab  Result Value Ref Range   SARS Coronavirus 2 by RT PCR NEGATIVE NEGATIVE   Influenza A by PCR NEGATIVE NEGATIVE   Influenza B by PCR NEGATIVE NEGATIVE  CBC   Collection Time: 01/23/20  3:20 AM  Result Value Ref Range   WBC 10.6 (H) 4.0 - 10.5 K/uL   RBC 4.92 3.87 - 5.11 MIL/uL   Hemoglobin 13.2 12.0 - 15.0 g/dL   HCT 16.1 36 - 46 %   MCV 82.5 80.0 - 100.0 fL   MCH 26.8 26.0 - 34.0 pg   MCHC 32.5 30.0 - 36.0 g/dL   RDW 09.6 04.5 - 40.9 %   Platelets 175 150 - 400 K/uL   nRBC 0.0 0.0 - 0.2 %  Type and screen   Collection Time: 01/23/20  3:20 AM  Result Value Ref Range   ABO/RH(D) PENDING    Antibody Screen PENDING    Sample Expiration      01/26/2020,2359 Performed at [facility], [address], [place], [place] [address]     Patient Active Problem List   Diagnosis Date Noted  . Normal labor 01/23/2020  . Supervision of normal first pregnancy, antepartum 10/05/2019  . Educated about COVID-19 virus infection 09/14/2019  . Palpitations 09/14/2019    Assessment: Lailoni Baquera is a 29 y.o. G1P0 at [redacted]w[redacted]d here for SOL   #Labor: expectant management, patient planning waterbirth  #Pain: Natural labor and delivery  #FWB: CAT I  #ID:  GBS neg  #MOF: Breast  #MOC: ?ppIUD #Circ:  Yes- inpatient   Sharyon Cable, CNM 01/23/2020, 4:18 AM

## 2020-01-23 NOTE — Progress Notes (Signed)
Kelly Thompson is a 29 y.o. G1P0 at 82w6dadmitted for active labor  Subjective: Called to room by pt request. Pt getting epidural, met with FOB and MGM first. Explained why pt is no longer a candidate for waterbirth despite rapid decrease in temp just after admin of Tylenol, and affirmed that I agreed with the previous CNM's plan of care. MGM asked if the temp could have been due to a stress response, I agreed that it could be but a stress response does not usually produce a temp that high and even if it was "just stress" it still warrants closer monitoring. Gave reassurance that epidurals are good tools when exhaustion is a factor. Both FOB and MGM expressed relief.  Entered room at the end of epidural administration. Pt calm and feeling relief. Wanted to process how the labor has gone so far. Gave reassurance and affirmation about the decision not to use the waterbirth tub, as well as the decision to use an epidural as pain relief and the importance of rest. Confirmed that she wanted me to assume care. Pt endorsed this and said she would feel more comfortable with someone she has met before.   Objective: BP 107/60   Pulse 100   Temp 98.7 F (37.1 C) (Oral)   Resp 17   Wt 193 lb 12.8 oz (87.9 kg)   LMP 04/19/2019   SpO2 99%   BMI 32.25 kg/m  No intake/output data recorded.  FHT:  FHR: 140 bpm, variability: moderate,  accelerations:  Present,  decelerations:  Absent UC:   regular, every 3-4 minutes SVE:   Dilation: 7 Effacement (%): 100 Station: -1 Exam by:: Emily C  Labs: Lab Results  Component Value Date   WBC 13.0 (H) 01/23/2020   HGB 13.1 01/23/2020   HCT 40.0 01/23/2020   MCV 83.5 01/23/2020   PLT 176 01/23/2020    Assessment / Plan: Spontaneous labor, progressing normally  Labor: Progressing normally Preeclampsia:  no signs or symptoms of toxicity Fetal Wellbeing:  Category I Pain Control:  Epidural I/D:  n/a Anticipated MOD:  NSVD   Pt amenable to Pitocin if  contractions space out. Planning to nap as RN helps her change positions with the peanut ball.   JGabriel Carina10/25/2021, 1:43 PM

## 2020-01-23 NOTE — Progress Notes (Signed)
LABOR PROGRESS NOTE  Charlette Hennings is a 29 y.o. G1P0 at [redacted]w[redacted]d  admitted for SOL  Subjective: Patient siting up in bed drinking apple juice, comfortable with the epidural at this time.   Objective: BP 121/74   Pulse 95   Temp 98.4 F (36.9 C) (Oral)   Resp 18   Wt 87.9 kg   LMP 04/19/2019   SpO2 99%   BMI 32.25 kg/m  or  Vitals:   01/23/20 1900 01/23/20 1945 01/23/20 2000 01/23/20 2030  BP: 122/80  125/77 121/74  Pulse: 84  90 95  Resp:  18 18 18   Temp:  98.4 F (36.9 C)    TempSrc:  Oral    SpO2:      Weight:        Currently on 10 milli-unit/min of pitocin  Dilation: 5 Effacement (%): 80 Cervical Position: Middle Station: -1 Presentation: Vertex Exam by:: [email or web address] RNC  FHT: baseline rate 135, moderate varibility, +accel, no decel Toco: 2-4/ moderate by palpation   Labs: Lab Results  Component Value Date   WBC 13.0 (H) 01/23/2020   HGB 13.1 01/23/2020   HCT 40.0 01/23/2020   MCV 83.5 01/23/2020   PLT 176 01/23/2020    Patient Active Problem List   Diagnosis Date Noted  . Normal labor 01/23/2020  . Supervision of normal first pregnancy, antepartum 10/05/2019  . Educated about COVID-19 virus infection 09/14/2019  . Palpitations 09/14/2019    Assessment / Plan: 29 y.o. G1P0 at [redacted]w[redacted]d here for SOL   Labor: patient was recently checked around 2015, will hold off on checking at this time. Discussed with patient that if no progression at next cervical examination then will plan to place IUPC. Patient agreeable to plan of care  Fetal Wellbeing:  Cat I  Pain Control:  Epidural  Anticipated MOD:  SVD  2016, CNM 01/23/2020, 9:28 PM

## 2020-01-23 NOTE — MAU Note (Signed)
.   Kelly Thompson is a 29 y.o. at [redacted]w[redacted]d here in MAU reporting: ctx that began at 2000. They are 3-4 mins apart. States she has bloody show. No LOF. Endorses good fetal movement. Induction set for Tuesday. GBS neg. Wants waterbirth.   Pain score: 9 Vitals:   01/23/20 0127  BP: 127/84  Pulse: 91  Resp: 16  Temp: 98.7 F (37.1 C)  SpO2: 100%     FHT:138

## 2020-01-23 NOTE — Progress Notes (Signed)
Pt refused IV access.  Imogene Burn, CNM aware.

## 2020-01-23 NOTE — Progress Notes (Deleted)
Kelly Thompson is a 29 y.o. G1P0 at [redacted]w[redacted]d admitted for active labor  Subjective: Pt asleep in semi-Fowler's. FOB at bedside and MGM at bedside for support.   Objective: BP 121/74   Pulse 95   Temp 98.4 F (36.9 C) (Oral)   Resp 18   Wt 87.9 kg   LMP 04/19/2019   SpO2 99%   BMI 32.25 kg/m  I/O last 3 completed shifts: In: -  Out: 600 [Urine:600] No intake/output data recorded.  FHT:  FHR: 130 bpm, variability: moderate,  accelerations:  Present,  decelerations:  Present very occasional variable UC:   regular, every 4-6 minutes SVE:   Dilation: 5 Effacement (%): 80 Station: -1 Exam by:: Rhunette Croft RNC   Labs: Lab Results  Component Value Date   WBC 13.0 (H) 01/23/2020   HGB 13.1 01/23/2020   HCT 40.0 01/23/2020   MCV 83.5 01/23/2020   PLT 176 01/23/2020    Assessment / Plan:  Labor: Continued improvement of cervical change and progressing station Fetal Wellbeing:  Cat 2 given intermittent variables with reassuring recovery and variability and good contractions with accels, if variables become more prominent and frequent, then will consider IUPC with amnioinfusion Pain Control:  Epidural, still pain on left side, called anesthesia. I/D:  CBC wnl, fever around 2100, amp/gent started Anticipated MOD:  Vaginal  Kathrin Greathouse, MD ,PGY-1 01/23/20 9:05 PM

## 2020-01-23 NOTE — Progress Notes (Signed)
Kelly Thompson is a 29 y.o. G1P0 at [redacted]w[redacted]d admitted for active labor  Subjective: Pt asleep in semi-Fowler's. FOB also asleep and MGM at bedside for support.   Objective: BP 124/76 (BP Location: Right Arm)   Pulse 78   Temp 98.3 F (36.8 C) (Oral)   Resp 17   Wt 193 lb 12.8 oz (87.9 kg)   LMP 04/19/2019   SpO2 99%   BMI 32.25 kg/m  No intake/output data recorded. Total I/O In: -  Out: 600 [Urine:600]  FHT:  FHR: 130 bpm, variability: moderate,  accelerations:  Present,  decelerations:  Present very occasional variable UC:   regular, every 6-7 minutes SVE:   Dilation: 5 Effacement (%): 80 Station: -1 Exam by:: Edd Arbour CNM  Labs: Lab Results  Component Value Date   WBC 13.0 (H) 01/23/2020   HGB 13.1 01/23/2020   HCT 40.0 01/23/2020   MCV 83.5 01/23/2020   PLT 176 01/23/2020    Assessment / Plan: Active labor with stall of dilation, to begin augmentation with Pitocin. Discussed with pt and MGM, both amenable to plan  Labor: Baby's head not as well applied to cervix, contractions have slowed somewhat, to begin Pitocin titration Preeclampsia:  no signs or symptoms of toxicity Fetal Wellbeing:  Category I Pain Control:  Epidural I/D:  Continued afebrile, CBC WNL Anticipated MOD:  NSVD  Edd Arbour, CNM, MSN, [facility] 01/23/20 4:12 PM

## 2020-01-23 NOTE — Progress Notes (Signed)
Labor Progress Note Kelly Thompson is a 29 y.o. G1P0 at [redacted]w[redacted]d presented for labor Notified by RN of temp 102.8  S:  Feeling better after IV pain meds.   O:  BP 120/76 (BP Location: Right Arm)   Pulse 93   Temp (!) 102.8 F (39.3 C) (Oral)   Resp 18   Wt 87.9 kg   LMP 04/19/2019   SpO2 100%   BMI 32.25 kg/m  EFM: baseline 135 bpm/ mod variability/ + accels/ no decels  Toco/IUPC: 3-4 SVE: deferred  A/P: 29 y.o. G1P0 [redacted]w[redacted]d  1. Labor: active 2. FWB: Cat I 3. Pain: IV meds 4. Maternal fever  Suspect Triple I, will obtain CBC, and give Tylenol, start abx if meets criteria. Discussed with patient and family, all questions answered. Anticipate labor progress and SVD.  Donette Larry, CNM 10:29 AM

## 2020-01-23 NOTE — Progress Notes (Signed)
LABOR PROGRESS NOTE  Kelly Thompson is a 29 y.o. G1P0 at [redacted]w[redacted]d  admitted for SOL  Subjective: Patient doing well, breathing through contractions, patient out of the tub and plans to walk unit   Objective: BP 121/79   Pulse 92   Temp 98.3 F (36.8 C) (Oral)   Resp 18   Wt 87.9 kg   LMP 04/19/2019   SpO2 100%   BMI 32.25 kg/m  or  Vitals:   01/23/20 0127 01/23/20 0134 01/23/20 0237 01/23/20 0335  BP: 127/84 123/83 129/79 121/79  Pulse: 91 98 90 92  Resp: 16  17 18   Temp: 98.7 F (37.1 C)  98.1 F (36.7 C) 98.3 F (36.8 C)  TempSrc: Oral  Oral Oral  SpO2: 100%     Weight: 87.9 kg       AROM @ 0523, clear fluid  Dilation: 6 Effacement (%): 100 Cervical Position: Middle Station: -1 Presentation: Vertex Exam by:: [email or web address], CNM FHT: baseline rate 150, moderate varibility, +accel, no decel Toco: 3 minutes/ moderate by palpation   Labs: Lab Results  Component Value Date   WBC 10.6 (H) 01/23/2020   HGB 13.2 01/23/2020   HCT 40.6 01/23/2020   MCV 82.5 01/23/2020   PLT 175 01/23/2020    Patient Active Problem List   Diagnosis Date Noted  . Normal labor 01/23/2020  . Supervision of normal first pregnancy, antepartum 10/05/2019  . Educated about COVID-19 virus infection 09/14/2019  . Palpitations 09/14/2019    Assessment / Plan: 29 y.o. G1P0 at [redacted]w[redacted]d here for SOL, plans waterbirth   Labor: Progressing well, AROM performed, expectant management, patient plans to get back in water around 0700 Fetal Wellbeing:  Cat I  Pain Control:  Natural labor and delivery, plans waterbirth  Anticipated MOD:  SVD  [redacted]w[redacted]d, CNM 01/23/2020, 5:51 AM

## 2020-01-23 NOTE — MAU Note (Signed)
Pt requesting to speak with provider prior to admission to review her birth plan

## 2020-01-23 NOTE — Progress Notes (Signed)
Labor Progress Note Miquel Lamson is a 29 y.o. G1P0 at [redacted]w[redacted]d presented for labor  S:  Sitting in chair, breathing through ctx. Requests to be checked.   O:  BP 123/82 (BP Location: Right Arm)   Pulse (!) 109   Temp 97.9 F (36.6 C) (Oral)   Resp 17   Wt 87.9 kg   LMP 04/19/2019   SpO2 100%   BMI 32.25 kg/m  EFM: baseline 140 bpm/ mod variability/ + accels/ no decels  Toco/IUPC: 3-4 SVE: Dilation: 6 Effacement (%): 100 Cervical Position: Middle Station: -1 Presentation: Vertex Exam by:: Melaine CNM   A/P: 29 y.o. G1P0 [redacted]w[redacted]d  1. Labor: early active 2. FWB: Cat I 3. Pain: labor support w/o meds   Pt asking about options for pain control. Discussed hydrotherapy (could also try shower), IV pain meds, and epidural. Another option would be to get one dose of Fentanyl for rest then back in tub after it wears off. She will think about her options and let us know. Anticipate SVD.  Donette Larry, CNM 9:17 AM

## 2020-01-23 NOTE — Anesthesia Procedure Notes (Signed)
Epidural Patient location during procedure: OB Start time: 01/23/2020 12:39 PM End time: 01/23/2020 12:49 PM  Staffing Anesthesiologist: Elmer Picker, MD Performed: anesthesiologist   Preanesthetic Checklist Completed: patient identified, IV checked, risks and benefits discussed, monitors and equipment checked, pre-op evaluation and timeout performed  Epidural Patient position: sitting Prep: DuraPrep and site prepped and draped Patient monitoring: continuous pulse ox, blood pressure, heart rate and cardiac monitor Approach: midline Location: L3-L4 Injection technique: LOR air  Needle:  Needle type: Tuohy  Needle gauge: 17 G Needle length: 9 cm Needle insertion depth: 5 cm Catheter type: closed end flexible Catheter size: 19 Gauge Catheter at skin depth: 11 cm Test dose: negative  Assessment Sensory level: T8 Events: blood not aspirated, injection not painful, no injection resistance, no paresthesia and negative IV test  Additional Notes Patient identified. Risks/Benefits/Options discussed with patient including but not limited to bleeding, infection, nerve damage, paralysis, failed block, incomplete pain control, headache, blood pressure changes, nausea, vomiting, reactions to medication both or allergic, itching and postpartum back pain. Confirmed with bedside nurse the patient's most recent platelet count. Confirmed with patient that they are not currently taking any anticoagulation, have any bleeding history or any family history of bleeding disorders. Patient expressed understanding and wished to proceed. All questions were answered. Sterile technique was used throughout the entire procedure. Please see nursing notes for vital signs. Test dose was given through epidural catheter and negative prior to continuing to dose epidural or start infusion. Warning signs of high block given to the patient including shortness of breath, tingling/numbness in hands, complete motor  block, or any concerning symptoms with instructions to call for help. Patient was given instructions on fall risk and not to get out of bed. All questions and concerns addressed with instructions to call with any issues or inadequate analgesia.  Reason for block:procedure for pain

## 2020-01-23 NOTE — Anesthesia Preprocedure Evaluation (Signed)
Anesthesia Evaluation  Patient identified by MRN, date of birth, ID band Patient awake    Reviewed: Allergy & Precautions, NPO status , Patient's Chart, lab work & pertinent test results  Airway Mallampati: II  TM Distance: >3 FB Neck ROM: Full    Dental no notable dental hx.    Pulmonary asthma ,    Pulmonary exam normal breath sounds clear to auscultation       Cardiovascular negative cardio ROS Normal cardiovascular exam Rhythm:Regular Rate:Normal     Neuro/Psych negative neurological ROS  negative psych ROS   GI/Hepatic negative GI ROS, Neg liver ROS,   Endo/Other  negative endocrine ROS  Renal/GU negative Renal ROS  negative genitourinary   Musculoskeletal negative musculoskeletal ROS (+)   Abdominal   Peds  Hematology negative hematology ROS (+)   Anesthesia Other Findings   Reproductive/Obstetrics (+) Pregnancy                             Anesthesia Physical Anesthesia Plan  ASA: II  Anesthesia Plan: Epidural   Post-op Pain Management:    Induction:   PONV Risk Score and Plan: Treatment may vary due to age or medical condition  Airway Management Planned: Natural Airway  Additional Equipment:   Intra-op Plan:   Post-operative Plan:   Informed Consent: I have reviewed the patients History and Physical, chart, labs and discussed the procedure including the risks, benefits and alternatives for the proposed anesthesia with the patient or authorized representative who has indicated his/her understanding and acceptance.       Plan Discussed with: Anesthesiologist  Anesthesia Plan Comments: (Patient identified. Risks, benefits, options discussed with patient including but not limited to bleeding, infection, nerve damage, paralysis, failed block, incomplete pain control, headache, blood pressure changes, nausea, vomiting, reactions to medication, itching, and post partum  back pain. Confirmed with bedside nurse the patient's most recent platelet count. Confirmed with the patient that they are not taking any anticoagulation, have any bleeding history or any family history of bleeding disorders. Patient expressed understanding and wishes to proceed. All questions were answered. )        Anesthesia Quick Evaluation  

## 2020-01-24 ENCOUNTER — Encounter (HOSPITAL_COMMUNITY): Payer: Self-pay | Admitting: Obstetrics and Gynecology

## 2020-01-24 ENCOUNTER — Inpatient Hospital Stay (HOSPITAL_COMMUNITY): Payer: Medicaid Other

## 2020-01-24 ENCOUNTER — Inpatient Hospital Stay (HOSPITAL_COMMUNITY)
Admission: AD | Admit: 2020-01-24 | Payer: Medicaid Other | Source: Home / Self Care | Admitting: Obstetrics and Gynecology

## 2020-01-24 DIAGNOSIS — Z3A40 40 weeks gestation of pregnancy: Secondary | ICD-10-CM

## 2020-01-24 DIAGNOSIS — O4212 Full-term premature rupture of membranes, onset of labor more than 24 hours following rupture: Secondary | ICD-10-CM

## 2020-01-24 MED ORDER — BENZOCAINE-MENTHOL 20-0.5 % EX AERO
1.0000 "application " | INHALATION_SPRAY | CUTANEOUS | Status: DC | PRN
  Administered 2020-01-25: 1 via TOPICAL
  Filled 2020-01-24: qty 56

## 2020-01-24 MED ORDER — DIBUCAINE (PERIANAL) 1 % EX OINT: 1.0000 "application " | TOPICAL_OINTMENT | CUTANEOUS | Status: DC | PRN

## 2020-01-24 MED ORDER — TETANUS-DIPHTH-ACELL PERTUSSIS 5-2.5-18.5 LF-MCG/0.5 IM SUSY: 0.5000 mL | PREFILLED_SYRINGE | Freq: Once | INTRAMUSCULAR | Status: DC

## 2020-01-24 MED ORDER — PRENATAL MULTIVITAMIN CH
1.0000 | ORAL_TABLET | Freq: Every day | ORAL | Status: DC
  Administered 2020-01-24 – 2020-01-26 (×3): 1 via ORAL
  Filled 2020-01-24 (×3): qty 1

## 2020-01-24 MED ORDER — DIPHENHYDRAMINE HCL 25 MG PO CAPS: 25.0000 mg | ORAL_CAPSULE | Freq: Four times a day (QID) | ORAL | Status: DC | PRN

## 2020-01-24 MED ORDER — ONDANSETRON HCL 4 MG/2ML IJ SOLN: 4.0000 mg | INTRAMUSCULAR | Status: DC | PRN

## 2020-01-24 MED ORDER — SIMETHICONE 80 MG PO CHEW: 80.0000 mg | CHEWABLE_TABLET | ORAL | Status: DC | PRN

## 2020-01-24 MED ORDER — ACETAMINOPHEN 325 MG PO TABS: 650.0000 mg | ORAL_TABLET | ORAL | Status: DC | PRN

## 2020-01-24 MED ORDER — OXYTOCIN-SODIUM CHLORIDE 30-0.9 UT/500ML-% IV SOLN
2.5000 [IU]/h | INTRAVENOUS | Status: DC
  Administered 2020-01-24: 2.5 [IU]/h via INTRAVENOUS

## 2020-01-24 MED ORDER — IBUPROFEN 600 MG PO TABS
600.0000 mg | ORAL_TABLET | Freq: Four times a day (QID) | ORAL | Status: DC
  Administered 2020-01-24 – 2020-01-26 (×7): 600 mg via ORAL
  Filled 2020-01-24 (×8): qty 1

## 2020-01-24 MED ORDER — WITCH HAZEL-GLYCERIN EX PADS: 1.0000 "application " | MEDICATED_PAD | CUTANEOUS | Status: DC | PRN

## 2020-01-24 MED ORDER — ONDANSETRON HCL 4 MG PO TABS: 4.0000 mg | ORAL_TABLET | ORAL | Status: DC | PRN

## 2020-01-24 MED ORDER — SENNOSIDES-DOCUSATE SODIUM 8.6-50 MG PO TABS
2.0000 | ORAL_TABLET | ORAL | Status: DC
  Administered 2020-01-24: 2 via ORAL
  Filled 2020-01-24 (×2): qty 2

## 2020-01-24 MED ORDER — COCONUT OIL OIL
1.0000 "application " | TOPICAL_OIL | Status: DC | PRN
  Administered 2020-01-25: 1 via TOPICAL

## 2020-01-24 MED ORDER — ZOLPIDEM TARTRATE 5 MG PO TABS: 5.0000 mg | ORAL_TABLET | Freq: Every evening | ORAL | Status: DC | PRN

## 2020-01-24 NOTE — Lactation Note (Addendum)
This note was copied from a baby's chart. Lactation Consultation Note  Patient Name: Kelly Thompson Today's Date: 01/24/2020   Infant is 41 weeks 11 hours old. Infant currently in NICU for oxygen instability.  LC went to OB speciality care to get Mom set up on a breast pump. Mom is in the NICU. LC alerted supervisor, Margaret I, Mom needed help with latching.   LC left instructions with RN, Corrine, to set Mom up on DEBP left in the room.   LC returned and was able to get a DEBP set up in the room from equipment services. LC met with Mom and reviewed parts, assembly, cleaning and storage on the DEBP.   LC examined Mom's breast which appear flat but become erect with stimulation. Mom has latched infant at the breast in NICU. LC noted Mom had a compression stripe and gave her some suggestions to improve the latch. LC also gave Mom a video to review no how to improve her latch.   Mom does not have a breast pump at home. Mom states she has Medicaid but does not have WIC.   Plan 1. To feed based on cues 8-12 x in 24 hour period, no more than 4 hours without an attempt.           2. Mom to increase stimulation with hand expression and breast massage prior to latching.          3.  Use DEBP pumping q 3 hours for 15 minutes.           4. LC reviewed the brochure on inpatient and outpatient services with Mom.   

## 2020-01-24 NOTE — Lactation Note (Signed)
This note was copied from a baby's chart. Lactation Consultation Note  Patient Name: Kelly Thompson [id number] Date: 01/24/2020 Reason for consult: NICU baby;Primapara;1st time breastfeeding;Term;Other (Comment);Follow-up assessment (41/0/7 weeks) Baby is 16 hours old  This LC was called to 311 to assist with a nipple Shield on the left breast.  When LC arrived baby had latched on the right breast and nursed well .  Baby wide awake , rooting and LC assisted 1st to latch on the left breast  On and off , latched for a few sucks and released. LC sized mom for a #24 NS and baby was latching on and off.  The areola compresses well.  LC recommended since the baby is actively rooting to relatch on the right breast since per mom she has been leaking on that side. Mom needed some assist to obtain the depth and baby fed for 14 minutes with swallows.  Mom has the #24 NS is needed.  Another LC saw mom in room 102 and set up the DEBP earlier.  Per mom will be getting a DEBP - Medela from insurance company.    Maternal Data Has patient been taught Hand Expression?: Yes Does the patient have breastfeeding experience prior to this delivery?: No  Feeding Feeding Type: Breast Fed  LATCH Score Latch: Repeated attempts needed to sustain latch, nipple held in mouth throughout feeding, stimulation needed to elicit sucking reflex.  Audible Swallowing: Spontaneous and intermittent  Type of Nipple: Everted at rest and after stimulation  Comfort (Breast/Nipple): Soft / non-tender  Hold (Positioning): Assistance needed to correctly position infant at breast and maintain latch.  LATCH Score: 8  Interventions Interventions: Breast feeding basics reviewed;Assisted with latch;Skin to skin;Breast massage;Reverse pressure;Breast compression;Adjust position;Support pillows;Position options  Lactation Tools Discussed/Used Tools: Pump Flange Size: 24 (didn't need the NS to latch on the right breast) Breast  pump type: Double-Electric Breast Pump WIC Program: No Pump Review: Milk Storage Initiated by:: IBCLC Date initiated:: 01/24/20   Consult Status Consult Status: Follow-up Date: 01/25/20 Follow-up type: In-patient    Matilde Sprang Jasnoor Trussell 01/24/2020, 6:53 PM

## 2020-01-24 NOTE — Discharge Summary (Signed)
Postpartum Discharge Summary  Date of Service updated 01/26/2020     Patient Name: Kelly Thompson DOB: Apr 17, 1990 MRN: [id number]  Date of admission: 01/23/2020 Delivery date:01/24/2020  Delivering provider: Lajean Manes  Date of discharge:  01/26/2020  Admitting diagnosis: Normal labor [O80, Z37.9] Intrauterine pregnancy: [redacted]w[redacted]d    Secondary diagnosis:  Active Problems:   Normal labor   SVD (spontaneous vaginal delivery)  Additional problems: none    Discharge diagnosis: Term Pregnancy Delivered                                              Post partum procedures:none Augmentation: AROM and Pitocin Complications: RZHG>99hours  Hospital course: Onset of Labor With Vaginal Delivery      29y.o. yo G1P1001 at 475w0das admitted in Latent Labor on 01/23/2020. Patient had an uncomplicated labor course as follows:  Membrane Rupture Time/Date: 5:23 AM ,01/23/2020   Delivery Method:Vaginal, Spontaneous  Episiotomy: None  Lacerations:  Labial  Patient had an uncomplicated postpartum course.  She is ambulating, tolerating a regular diet, passing flatus, and urinating well. Patient is discharged home in stable condition on  01/26/2020  Newborn Data: Birth date:01/24/2020  Birth time:5:18 AM  Gender:Female  Living status:Living  Apgars:7 ,8  Weight:3550 g   Magnesium Sulfate received: No BMZ received: No Rhophylac:N/A MMR:N/A T-DaP:Given prenatally Flu: No Transfusion:No  Physical exam  Vitals:   01/25/20 1927 01/25/20 2308 01/25/20 2352 01/26/20 0458  BP: 120/85 120/71 116/76 114/70  Pulse: 69 61 61 63  Resp: 18 16  16   Temp: (!) 97.5 F (36.4 C) 98.6 F (37 C)  97.6 F (36.4 C)  TempSrc: Oral Oral  Oral  SpO2: 98% 99%  99%  Weight:      Height:       General: alert, cooperative and no distress Lochia: appropriate Uterine Fundus: firm Incision: N/A DVT Evaluation: No evidence of DVT seen on physical exam. Labs: Lab Results  Component Value Date    WBC 13.0 (H) 01/23/2020   HGB 13.1 01/23/2020   HCT 40.0 01/23/2020   MCV 83.5 01/23/2020   PLT 176 01/23/2020   CMP Latest Ref Rng & Units 09/07/2019  Glucose 70 - 99 mg/dL 81  BUN 6 - 20 mg/dL 6  Creatinine 0.44 - 1.00 mg/dL 0.60  Sodium 135 - 145 mmol/L 136  Potassium 3.5 - 5.1 mmol/L 3.3(L)  Chloride 98 - 111 mmol/L 102  CO2 22 - 32 mmol/L 23  Calcium 8.9 - 10.3 mg/dL 9.2  Total Protein 6.5 - 8.1 g/dL 6.6  Total Bilirubin 0.3 - 1.2 mg/dL 0.3  Alkaline Phos 38 - 126 U/L 48  AST 15 - 41 U/L 17  ALT 0 - 44 U/L 18   Edinburgh Score: Edinburgh Postnatal Depression Scale Screening Tool 01/25/2020  I have been able to laugh and see the funny side of things. (No Data)     After visit meds:  Allergies as of 01/26/2020   No Known Allergies     Medication List    STOP taking these medications   Azelaic Acid 15 % cream   Blood Pressure Monitor Automat Devi   Comfort Fit Maternity Supp Sm Misc   cyclobenzaprine 10 MG tablet Commonly known as: FLEXERIL   Dapsone 5 % topical gel     TAKE these medications   Ameda  Finesse Breast Pump Misc 1 Device by Does not apply route as directed.   Medela Double Breast Pump Misc 1 Device by Does not apply route as directed.   ibuprofen 600 MG tablet Commonly known as: ADVIL Take 1 tablet (600 mg total) by mouth every 6 (six) hours.   prenatal multivitamin Tabs tablet Take 1 tablet by mouth daily at 12 noon.        Discharge home in stable condition Infant Feeding: No evidence of DVT seen on physical exam. Infant Disposition:home with mother Discharge instruction: per After Visit Summary and Postpartum booklet. Activity: Advance as tolerated. Pelvic rest for 6 weeks.  Diet: routine diet Future Appointments: Future Appointments  Date Time Provider [place]  03/01/2020  3:10 PM Laury Deep, CNM [facility] None   Follow up Visit:  Follow-up Information    [facility] Follow up in 4  week(s).   Specialty: Obstetrics and Gynecology Contact information: [place] [phone]               Please schedule this patient for a In person postpartum visit in 4 weeks with the following provider: Any provider. Additional Postpartum F/U:none  Low risk pregnancy complicated by: nothing Delivery mode:  Vaginal, Spontaneous  Anticipated Birth Control:  IUD   01/26/2020 Emeterio Reeve, MD

## 2020-01-24 NOTE — Anesthesia Postprocedure Evaluation (Signed)
Anesthesia Post Note  Patient: Kelly Thompson  Procedure(s) Performed: AN AD HOC LABOR EPIDURAL     Patient location during evaluation: Mother Baby Anesthesia Type: Epidural Level of consciousness: awake and alert Pain management: pain level controlled Vital Signs Assessment: post-procedure vital signs reviewed and stable Respiratory status: spontaneous breathing, nonlabored ventilation and respiratory function stable Cardiovascular status: stable Postop Assessment: no headache, no backache and epidural receding Anesthetic complications: no   No complications documented.  Last Vitals:  Vitals:   01/24/20 0859 01/24/20 1407  BP: 131/79 124/79  Pulse: 85 88  Resp: 18 16  Temp: 36.8 C 37 C  SpO2: 98% 98%    Last Pain:  Vitals:   01/24/20 1407  TempSrc: Oral  PainSc:    Pain Goal:                   Kue Fox

## 2020-01-25 NOTE — Lactation Note (Signed)
This note was copied from a baby's chart. Lactation Consultation Note  Patient Name: Kelly Thompson [id number] Date: 01/25/2020 Reason for consult: Follow-up assessment   Infant to transfer to mother's room. Per mom, infant is bf well on R breast but less enthusiastic about bf on L breast. Reviewed strategies and common reasons for infant preference in early days. Mom has +breast changes today and continues to pump between bf prn. Answered questions regarding milk storage. Mom has DEBP at home for use p d/c. Provided opportunity for questions and addressed all concerns. Will plan f/u care prn.   Consult Status Consult Status: Follow-up Date: 01/26/20 Follow-up type: In-patient    Elder Negus 01/25/2020, 10:44 AM

## 2020-01-25 NOTE — Progress Notes (Signed)
Post Partum Day 1: VD  Subjective: No complaints, up ad lib, voiding, tolerating PO and + flatus.   Objective: Blood pressure (!) 98/55, pulse 62, temperature 97.8 F (36.6 C), temperature source Oral, resp. rate 18, height 5\' 5"  (1.651 m), weight 87.9 kg, last menstrual period 04/19/2019, SpO2 99 %, unknown if currently breastfeeding.  Physical Exam:  General: alert and no distress Lochia: appropriate Uterine Fundus: firm, NT  DVT Evaluation: No evidence of DVT seen on physical exam. Negative Homan's sign. No cords or calf tenderness.  Recent Labs    01/23/20 0320 01/23/20 1034  HGB 13.2 13.1  HCT 40.6 40.0    Assessment/Plan: Plan for discharge tomorrow, Breastfeeding, Circumcision after discharge and Contraception Pills vs office IUD Routine postpartum care   LOS: 2 days   01/25/20, MD 01/25/2020, 9:42 AM

## 2020-01-25 NOTE — Plan of Care (Signed)
  Problem: Education: Goal: Knowledge of General Education information will improve Description: Including pain rating scale, medication(s)/side effects and non-pharmacologic comfort measures Outcome: Completed/Met   Problem: Clinical Measurements: Goal: Ability to maintain clinical measurements within normal limits will improve Outcome: Completed/Met   Problem: Activity: Goal: Risk for activity intolerance will decrease Outcome: Completed/Met   Problem: Nutrition: Goal: Adequate nutrition will be maintained Outcome: Completed/Met   Problem: Coping: Goal: Level of anxiety will decrease Outcome: Completed/Met   Problem: Elimination: Goal: Will not experience complications related to urinary retention Outcome: Completed/Met   Problem: Pain Managment: Goal: General experience of comfort will improve Outcome: Completed/Met   Problem: Skin Integrity: Goal: Risk for impaired skin integrity will decrease Outcome: Completed/Met   Problem: Education: Goal: Knowledge of condition will improve Outcome: Completed/Met Goal: Individualized Educational Video(s) Outcome: Completed/Met   Problem: Activity: Goal: Will verbalize the importance of balancing activity with adequate rest periods Outcome: Completed/Met Goal: Ability to tolerate increased activity will improve Outcome: Completed/Met   Problem: Coping: Goal: Ability to identify and utilize available resources and services will improve Outcome: Completed/Met   Problem: Life Cycle: Goal: Chance of risk for complications during the postpartum period will decrease Outcome: Completed/Met   Problem: Role Relationship: Goal: Ability to demonstrate positive interaction with newborn will improve Outcome: Completed/Met   Problem: Clinical Measurements: Goal: Respiratory complications will improve Outcome: Not Applicable

## 2020-01-25 NOTE — Progress Notes (Signed)
Patient screened out for psychosocial assessment since none of the following apply:  Psychosocial stressors documented in mother or baby's chart  Gestation less than 32 weeks  Code at delivery   Infant with anomalies Please contact the Clinical Social Worker if specific needs arise, by MOB's request, or if MOB scores greater than 9/yes to question 10 on Edinburgh Postpartum Depression Screen.  Yoav Okane, LCSW Clinical Social Worker [facility] Cell#: [phone]     

## 2020-01-26 MED ORDER — IBUPROFEN 600 MG PO TABS
600.0000 mg | ORAL_TABLET | Freq: Four times a day (QID) | ORAL | 0 refills | Status: DC
Start: 2020-01-26 — End: 2021-08-27

## 2020-01-26 NOTE — Lactation Note (Signed)
This note was copied from a baby's chart. Lactation Consultation Note  Patient Name: Boy Josy Peaden JHERD'E Date: 01/26/2020 Reason for consult: Follow-up assessment;1st time breastfeeding LC to room for f/u visit. Mom and baby scheduled to d/c today. Mom has +breast changes and is comfortable with positioning/latch. Reviewed feeding with cues, and expectations over the next few days. Observed latch: LATCH score of 9. Patient offered opportunity to ask questions and all concerns were addressed. Pt to d/c with hand pump and has ordered pump for home use prn.   Feeding Feeding Type: Breast Milk  LATCH Score Latch: Grasps breast easily, tongue down, lips flanged, rhythmical sucking.  Audible Swallowing: Spontaneous and intermittent  Type of Nipple: Everted at rest and after stimulation  Comfort (Breast/Nipple): Soft / non-tender  Hold (Positioning): Assistance needed to correctly position infant at breast and maintain latch.  LATCH Score: 9  Interventions Interventions: Breast feeding basics reviewed;Skin to skin;Hand express;Adjust position;Position options;DEBP  Lactation Tools Discussed/Used     Consult Status Consult Status: Complete Date: 01/26/20 Follow-up type: In-patient    Elder Negus 01/26/2020, 9:10 AM

## 2020-01-26 NOTE — Discharge Instructions (Signed)
Postpartum Care After Vaginal Delivery This sheet gives you information about how to care for yourself from the time you deliver your baby to up to 6-12 weeks after delivery (postpartum period). Your health care provider may also give you more specific instructions. If you have problems or questions, contact your health care provider. Follow these instructions at home: Vaginal bleeding  It is normal to have vaginal bleeding (lochia) after delivery. Wear a sanitary pad for vaginal bleeding and discharge. ? During the first week after delivery, the amount and appearance of lochia is often similar to a menstrual period. ? Over the next few weeks, it will gradually decrease to a dry, yellow-brown discharge. ? For most women, lochia stops completely by 4-6 weeks after delivery. Vaginal bleeding can vary from woman to woman.  Change your sanitary pads frequently. Watch for any changes in your flow, such as: ? A sudden increase in volume. ? A change in color. ? Large blood clots.  If you pass a blood clot from your vagina, save it and call your health care provider to discuss. Do not flush blood clots down the toilet before talking with your health care provider.  Do not use tampons or douches until your health care provider says this is safe.  If you are not breastfeeding, your period should return 6-8 weeks after delivery. If you are feeding your child breast milk only (exclusive breastfeeding), your period may not return until you stop breastfeeding. Perineal care  Keep the area between the vagina and the anus (perineum) clean and dry as told by your health care provider. Use medicated pads and pain-relieving sprays and creams as directed.  If you had a cut in the perineum (episiotomy) or a tear in the vagina, check the area for signs of infection until you are healed. Check for: ? More redness, swelling, or pain. ? Fluid or blood coming from the cut or tear. ? Warmth. ? Pus or a bad  smell.  You may be given a squirt bottle to use instead of wiping to clean the perineum area after you go to the bathroom. As you start healing, you may use the squirt bottle before wiping yourself. Make sure to wipe gently.  To relieve pain caused by an episiotomy, a tear in the vagina, or swollen veins in the anus (hemorrhoids), try taking a warm sitz bath 2-3 times a day. A sitz bath is a warm water bath that is taken while you are sitting down. The water should only come up to your hips and should cover your buttocks. Breast care  Within the first few days after delivery, your breasts may feel heavy, full, and uncomfortable (breast engorgement). Milk may also leak from your breasts. Your health care provider can suggest ways to help relieve the discomfort. Breast engorgement should go away within a few days.  If you are breastfeeding: ? Wear a bra that supports your breasts and fits you well. ? Keep your nipples clean and dry. Apply creams and ointments as told by your health care provider. ? You may need to use breast pads to absorb milk that leaks from your breasts. ? You may have uterine contractions every time you breastfeed for up to several weeks after delivery. Uterine contractions help your uterus return to its normal size. ? If you have any problems with breastfeeding, work with your health care provider or lactation consultant.  If you are not breastfeeding: ? Avoid touching your breasts a lot. Doing this can make   your breasts produce more milk. ? Wear a good-fitting bra and use cold packs to help with swelling. ? Do not squeeze out (express) milk. This causes you to make more milk. Intimacy and sexuality  Ask your health care provider when you can engage in sexual activity. This may depend on: ? Your risk of infection. ? How fast you are healing. ? Your comfort and desire to engage in sexual activity.  You are able to get pregnant after delivery, even if you have not had  your period. If desired, talk with your health care provider about methods of birth control (contraception). Medicines  Take over-the-counter and prescription medicines only as told by your health care provider.  If you were prescribed an antibiotic medicine, take it as told by your health care provider. Do not stop taking the antibiotic even if you start to feel better. Activity  Gradually return to your normal activities as told by your health care provider. Ask your health care provider what activities are safe for you.  Rest as much as possible. Try to rest or take a nap while your baby is sleeping. Eating and drinking   Drink enough fluid to keep your urine pale yellow.  Eat high-fiber foods every day. These may help prevent or relieve constipation. High-fiber foods include: ? Whole grain cereals and breads. ? Brown rice. ? Beans. ? Fresh fruits and vegetables.  Do not try to lose weight quickly by cutting back on calories.  Take your prenatal vitamins until your postpartum checkup or until your health care provider tells you it is okay to stop. Lifestyle  Do not use any products that contain nicotine or tobacco, such as cigarettes and e-cigarettes. If you need help quitting, ask your health care provider.  Do not drink alcohol, especially if you are breastfeeding. General instructions  Keep all follow-up visits for you and your baby as told by your health care provider. Most women visit their health care provider for a postpartum checkup within the first 3-6 weeks after delivery. Contact a health care provider if:  You feel unable to cope with the changes that your child brings to your life, and these feelings do not go away.  You feel unusually sad or worried.  Your breasts become red, painful, or hard.  You have a fever.  You have trouble holding urine or keeping urine from leaking.  You have little or no interest in activities you used to enjoy.  You have not  breastfed at all and you have not had a menstrual period for 12 weeks after delivery.  You have stopped breastfeeding and you have not had a menstrual period for 12 weeks after you stopped breastfeeding.  You have questions about caring for yourself or your baby.  You pass a blood clot from your vagina. Get help right away if:  You have chest pain.  You have difficulty breathing.  You have sudden, severe leg pain.  You have severe pain or cramping in your lower abdomen.  You bleed from your vagina so much that you fill more than one sanitary pad in one hour. Bleeding should not be heavier than your heaviest period.  You develop a severe headache.  You faint.  You have blurred vision or spots in your vision.  You have bad-smelling vaginal discharge.  You have thoughts about hurting yourself or your baby. If you ever feel like you may hurt yourself or others, or have thoughts about taking your own life, get help  right away. You can go to the nearest emergency department or call:  Your local emergency services (911 in the [place]  A suicide crisis helpline, such as the National Suicide Prevention Lifeline at (208) 660-3460. This is open 24 hours a day. Summary  The period of time right after you deliver your newborn up to 6-12 weeks after delivery is called the postpartum period.  Gradually return to your normal activities as told by your health care provider.  Keep all follow-up visits for you and your baby as told by your health care provider. This information is not intended to replace advice given to you by your health care provider. Make sure you discuss any questions you have with your health care provider. Document Revised: 03/20/2017 Document Reviewed: 12/29/2016 Elsevier Patient Education  2020 [employer]. Places to have your son circumcised (for self-pay patients//Medicaid now covers circumcisions):                                                                       [facility]     [phone]  $480 while you are in hospital         [facility]              [phone]   $269 by 4 wks                      [facility]                     [phone]   $269 by 7 days [facility]                    [phone]   $269 by 4 wks [facility]             [phone]   $225 by 2 wks    These prices sometimes change but are roughly what you can expect to pay. Please call and confirm pricing.   There are many reasons parents decide to have their sons circumsized. During the first year of life circumcised males have a reduced risk of urinary tract infections but after this year the rates between circumcised males and uncircumcised males are the same.  It can reduce rate of HIV and other STI infection later in life.  It is safe to have your son circumcised outside of the hospital and the places above perform them regularly.   Deciding about Circumcision in Baby Boys  (Up-to-date The Basics)  What is circumcision?   Circumcision is a surgery that removes the skin that covers the tip of the penis, called the "foreskin" Circumcision is usually done when a boy is between 21 and 50 days old. In the [place], circumcision is common. In some other countries, fewer boys are circumcised. Circumcision is a common tradition in some religions.  Should I have my baby boy circumcised?   There is no easy answer. Circumcision has some benefits. But it also has risks. After talking with your doctor, you will have to decide for yourself what is right for your family.  What are the benefits of circumcision?   Circumcised boys seem to have slightly lower rates of: ?Urinary tract infections ?Swelling of the opening at  the tip of the penis Circumcised men seem to have slightly lower rates of: ?Urinary tract infections ?Swelling of the opening at the tip of the penis ?Penis cancer ?HIV and other infections that you catch during sex, or transmit to a future partner(s) ?Cervical cancer in the women  they have sex with Even so, in the [place], the risks of these problems are small - even in boys and men who have not been circumcised. Plus, boys and men who are not circumcised can reduce these extra risks by: ?Cleaning their penis well ?Using condoms during sex  What are the risks of circumcision?  Risks include: ?Bleeding or infection from the surgery ?Damage to or amputation of the penis ?A chance that the doctor will cut off too much or not enough of the foreskin ?A chance that sex won't feel as good later in life Only about 1 out of every 200 circumcisions leads to problems. There is also a chance that your health insurance won't pay for circumcision.  How is circumcision done in baby boys?  First, the baby gets medicine for pain relief. This might be a cream on the skin or a shot into the base of the penis. Next, the doctor cleans the baby's penis well. Then he or she uses special tools to cut off the foreskin. Finally, the doctor wraps a bandage (called gauze) around the baby's penis. If you have your baby circumcised, his doctor or nurse will give you instructions on how to care for him after the surgery. It is important that you follow those instructions carefully.    Postpartum Care After Vaginal Delivery This sheet gives you information about how to care for yourself from the time you deliver your baby to up to 6-12 weeks after delivery (postpartum period). Your health care provider may also give you more specific instructions. If you have problems or questions, contact your health care provider. Follow these instructions at home: Vaginal bleeding  It is normal to have vaginal bleeding (lochia) after delivery. Wear a sanitary pad for vaginal bleeding and discharge. ? During the first week after delivery, the amount and appearance of lochia is often similar to a menstrual period. ? Over the next few weeks, it will gradually decrease to a dry, yellow-brown  discharge. ? For most women, lochia stops completely by 4-6 weeks after delivery. Vaginal bleeding can vary from woman to woman.  Change your sanitary pads frequently. Watch for any changes in your flow, such as: ? A sudden increase in volume. ? A change in color. ? Large blood clots.  If you pass a blood clot from your vagina, save it and call your health care provider to discuss. Do not flush blood clots down the toilet before talking with your health care provider.  Do not use tampons or douches until your health care provider says this is safe.  If you are not breastfeeding, your period should return 6-8 weeks after delivery. If you are feeding your child breast milk only (exclusive breastfeeding), your period may not return until you stop breastfeeding. Perineal care  Keep the area between the vagina and the anus (perineum) clean and dry as told by your health care provider. Use medicated pads and pain-relieving sprays and creams as directed.  If you had a cut in the perineum (episiotomy) or a tear in the vagina, check the area for signs of infection until you are healed. Check for: ? More redness, swelling, or pain. ? Fluid or blood coming  from the cut or tear. ? Warmth. ? Pus or a bad smell.  You may be given a squirt bottle to use instead of wiping to clean the perineum area after you go to the bathroom. As you start healing, you may use the squirt bottle before wiping yourself. Make sure to wipe gently.  To relieve pain caused by an episiotomy, a tear in the vagina, or swollen veins in the anus (hemorrhoids), try taking a warm sitz bath 2-3 times a day. A sitz bath is a warm water bath that is taken while you are sitting down. The water should only come up to your hips and should cover your buttocks. Breast care  Within the first few days after delivery, your breasts may feel heavy, full, and uncomfortable (breast engorgement). Milk may also leak from your breasts. Your health  care provider can suggest ways to help relieve the discomfort. Breast engorgement should go away within a few days.  If you are breastfeeding: ? Wear a bra that supports your breasts and fits you well. ? Keep your nipples clean and dry. Apply creams and ointments as told by your health care provider. ? You may need to use breast pads to absorb milk that leaks from your breasts. ? You may have uterine contractions every time you breastfeed for up to several weeks after delivery. Uterine contractions help your uterus return to its normal size. ? If you have any problems with breastfeeding, work with your health care provider or Ad[employer] copy[employer].  If you are not breastfeeding: ? Avoid touching your breasts a lot. Doing this can make your breasts produce more milk. ? Wear a good-fitting bra and use cold packs to help with swelling. ? Do not squeeze out (express) milk. This causes you to make more milk. Intimacy and sexuality  Ask your health care provider when you can engage in sexual activity. This may depend on: ? Your risk of infection. ? How fast you are healing. ? Your comfort and desire to engage in sexual activity.  You are able to get pregnant after delivery, even if you have not had your period. If desired, talk with your health care provider about methods of birth control (contraception). Medicines  Take over-the-counter and prescription medicines only as told by your health care provider.  If you were prescribed an antibiotic medicine, take it as told by your health care provider. Do not stop taking the antibiotic even if you start to feel better. Activity  Gradually return to your normal activities as told by your health care provider. Ask your health care provider what activities are safe for you.  Rest as much as possible. Try to rest or take a nap while your baby is sleeping. Eating and drinking   Drink enough fluid to keep your urine pale yellow.  Eat high-fiber  foods every day. These may help prevent or relieve constipation. High-fiber foods include: ? Whole grain cereals and breads. ? Brown rice. ? Beans. ? Fresh fruits and vegetables.  Do not try to lose weight quickly by cutting back on calories.  Take your prenatal vitamins until your postpartum checkup or until your health care provider tells you it is okay to stop. Lifestyle  Do not use any products that contain nicotine or tobacco, such as cigarettes and e-cigarettes. If you need help quitting, ask your health care provider.  Do not drink alcohol, especially if you are breastfeeding. General instructions  Keep all follow-up visits for you and your baby  as told by your health care provider. Most women visit their health care provider for a postpartum checkup within the first 3-6 weeks after delivery. Contact a health care provider if:  You feel unable to cope with the changes that your child brings to your life, and these feelings do not go away.  You feel unusually sad or worried.  Your breasts become red, painful, or hard.  You have a fever.  You have trouble holding urine or keeping urine from leaking.  You have little or no interest in activities you used to enjoy.  You have not breastfed at all and you have not had a menstrual period for 12 weeks after delivery.  You have stopped breastfeeding and you have not had a menstrual period for 12 weeks after you stopped breastfeeding.  You have questions about caring for yourself or your baby.  You pass a blood clot from your vagina. Get help right away if:  You have chest pain.  You have difficulty breathing.  You have sudden, severe leg pain.  You have severe pain or cramping in your lower abdomen.  You bleed from your vagina so much that you fill more than one sanitary pad in one hour. Bleeding should not be heavier than your heaviest period.  You develop a severe headache.  You faint.  You have blurred vision or  spots in your vision.  You have bad-smelling vaginal discharge.  You have thoughts about hurting yourself or your baby. If you ever feel like you may hurt yourself or others, or have thoughts about taking your own life, get help right away. You can go to the nearest emergency department or call:  Your local emergency services (911 in the [place]  A suicide crisis helpline, such as the National Suicide Prevention Lifeline at (317) 524-53541-337-549-7311. This is open 24 hours a day. Summary  The period of time right after you deliver your newborn up to 6-12 weeks after delivery is called the postpartum period.  Gradually return to your normal activities as told by your health care provider.  Keep all follow-up visits for you and your baby as told by your health care provider. This information is not intended to replace advice given to you by your health care provider. Make sure you discuss any questions you have with your health care provider. Document Revised: 03/20/2017 Document Reviewed: 12/29/2016 Elsevier Patient Education  2020 [employer]

## 2020-01-26 NOTE — Plan of Care (Signed)
  Problem: Elimination: Goal: Will not experience complications related to bowel motility Outcome: Completed/Met

## 2020-03-01 ENCOUNTER — Encounter: Payer: Self-pay | Admitting: Obstetrics and Gynecology

## 2020-03-01 ENCOUNTER — Ambulatory Visit (INDEPENDENT_AMBULATORY_CARE_PROVIDER_SITE_OTHER): Payer: Medicaid Other | Admitting: Obstetrics and Gynecology

## 2020-03-01 ENCOUNTER — Other Ambulatory Visit: Payer: Self-pay

## 2020-03-01 DIAGNOSIS — Z30011 Encounter for initial prescription of contraceptive pills: Secondary | ICD-10-CM

## 2020-03-01 MED ORDER — NORETHINDRONE 0.35 MG PO TABS: 1.0000 | ORAL_TABLET | Freq: Every day | ORAL | 6 refills | Status: DC

## 2020-03-01 NOTE — Progress Notes (Signed)
Post Partum Visit Note  Kelly Thompson is a 29 y.o. [id number] female who presents for a postpartum visit. She is 5 weeks postpartum following a normal spontaneous vaginal delivery.  I have fully reviewed the prenatal and intrapartum course. The delivery was at 41.0 gestational weeks.  Anesthesia: epidural. Postpartum course has been uncomplicated. Baby is doing well; gaining weight (weighs 10+ lbs now). Baby is feeding by breast. Bleeding no bleeding. Bowel function is normal. Bladder function is normal. Patient is not sexually active. Contraception method is oral progesterone-only contraceptive. Postpartum depression screening: negative.   The pregnancy intention screening data noted above was reviewed. Potential methods of contraception were discussed. The patient elected to proceed with Oral Contraceptive.    Edinburgh Postnatal Depression Scale - 03/01/20 1512      Edinburgh Postnatal Depression Scale:  In the Past 7 Days   I have been able to laugh and see the funny side of things. 0    I have looked forward with enjoyment to things. 0    I have blamed myself unnecessarily when things went wrong. 0    I have been anxious or worried for no good reason. 0    I have felt scared or panicky for no good reason. 0    Things have been getting on top of me. 0    I have been so unhappy that I have had difficulty sleeping. 0    I have felt sad or miserable. 0    I have been so unhappy that I have been crying. 0    The thought of harming myself has occurred to me. 0    Edinburgh Postnatal Depression Scale Total 0            The following portions of the patient's history were reviewed and updated as appropriate: allergies, current medications, past family history, past medical history, past social history, past surgical history and problem list.  Review of Systems Constitutional: negative Eyes: negative Ears, nose, mouth, throat, and face: negative Respiratory: negative Cardiovascular:  negative Gastrointestinal: negative Genitourinary:negative Integument/breast: negative Hematologic/lymphatic: negative Musculoskeletal:negative Neurological: negative Behavioral/Psych: negative Endocrine: negative Allergic/Immunologic: negative    Objective:  BP 112/70 (BP Location: Left Arm, Patient Position: Sitting, Cuff Size: Normal)   Pulse 60   Temp (!) 97.5 F (36.4 C) (Oral)   Ht 5\' 5"  (1.651 m)   Wt 168 lb 12.8 oz (76.6 kg)   LMP 04/19/2019   Breastfeeding Yes   BMI 28.09 kg/m    General:  alert, cooperative and no distress   Breasts:  inspection negative, no nipple discharge or bleeding, no masses or nodularity palpable  Lungs: clear to auscultation bilaterally  Heart:  regular rate and rhythm, S1, S2 normal, no murmur, click, rub or gallop  Abdomen: soft, non-tender; bowel sounds normal; no masses,  no organomegaly   Vulva:  not evaluated  Vagina: not evaluated  Cervix:  not evaluated  Corpus: not examined  Adnexa:  not evaluated  Rectal Exam: Not performed.        Assessment:  Encounter for postpartum care of lactating mother - Normal postpartum exam. Pap smear not done at today's visit.   Encounter for initial prescription of contraceptive pills  - Rx for norethindrone (MICRONOR) 0.35 MG tablet  Plan:   Essential components of care per ACOG recommendations:  1.  Mood and well being: Patient with negative depression screening today. Reviewed local resources for support.  - Patient does not use tobacco.  - hx  of drug use? No    2. Infant care and feeding:  -Patient currently breastmilk feeding? Yes If breastmilk feeding discussed return to work and pumping. If needed, patient was provided letter for work to allow for every 2-3 hr pumping breaks, and to be granted a private location to express breastmilk and refrigerated area to store breastmilk. Reviewed importance of draining breast regularly to support lactation. -Social determinants of health (SDOH)  reviewed in EPIC. No concerns.  3. Sexuality, contraception and birth spacing - Patient does not want a pregnancy in the next year.  Desired family size is 1 children.  - Reviewed forms of contraception in tiered fashion. Patient desired oral progesterone-only contraceptive today.   - Discussed birth spacing of 18 months  4. Sleep and fatigue -Encouraged family/partner/community support of 4 hrs of uninterrupted sleep to help with mood and fatigue  5. Physical Recovery  - Discussed patients delivery and complications - Patient had labial laceration, perineal healing reviewed. Patient expressed understanding - Patient has urinary incontinence? No  - Patient is safe to resume physical and sexual activity  6.  Health Maintenance - Last pap smear done 12/29/2018 and was normal with negative HPV. Mammogram  7. No Chronic Disease - PCP follow up  Raelyn Mora, CNM [facility] [employer], [facility]

## 2020-12-19 ENCOUNTER — Ambulatory Visit: Payer: Medicaid Other | Admitting: Certified Nurse Midwife

## 2020-12-26 ENCOUNTER — Ambulatory Visit: Payer: Medicaid Other | Admitting: Certified Nurse Midwife

## 2021-01-08 ENCOUNTER — Encounter: Payer: Self-pay | Admitting: Obstetrics and Gynecology

## 2021-08-27 ENCOUNTER — Ambulatory Visit (INDEPENDENT_AMBULATORY_CARE_PROVIDER_SITE_OTHER): Payer: Medicaid Other

## 2021-08-27 ENCOUNTER — Ambulatory Visit
Admission: RE | Admit: 2021-08-27 | Discharge: 2021-08-27 | Disposition: A | Discharge status: Home / Self Care | Payer: Medicaid Other | Source: Ambulatory Visit | Attending: Emergency Medicine | Admitting: Emergency Medicine

## 2021-08-27 VITALS — BP 105/65 | HR 96 | Temp 98.2°F | Resp 16

## 2021-08-27 DIAGNOSIS — S83412A Sprain of medial collateral ligament of left knee, initial encounter: Secondary | ICD-10-CM

## 2021-08-27 DIAGNOSIS — M25562 Pain in left knee: Secondary | ICD-10-CM | POA: Diagnosis not present

## 2021-08-27 NOTE — ED Provider Notes (Signed)
[facility]    CSN: [id number] Arrival date & time: 08/27/21  1313    HISTORY   Chief Complaint  Patient presents with   Knee Injury    Entered by patient   HPI Kelly Thompson is a 31 y.o. female. Pt states while cleaning something off the floor, her left leg slid from underneath her, awkwardly, and she is now having left knee pain.  Patient was able to ambulate independently into the clinic today.  Patient states she has not applied ice to her knee or taken any pain medications prior to arrival.  Patient denies swelling or redness of her left knee.  Imaging of left knee is unremarkable.   Past Medical History:  Diagnosis Date   Asthma    allergy induced, environmental   Irregular menstrual bleeding    Patient Active Problem List   Diagnosis Date Noted   SVD (spontaneous vaginal delivery) 01/24/2020   Normal labor 01/23/2020   Supervision of normal first pregnancy, antepartum 10/05/2019   Educated about COVID-19 virus infection 09/14/2019   Palpitations 09/14/2019   Past Surgical History:  Procedure Laterality Date   WISDOM TOOTH EXTRACTION     OB History     Gravida  1   Para  1   Term  1   Preterm      AB      Living  1      SAB      IAB      Ectopic      Multiple  0   Live Births  1          Home Medications    Prior to Admission medications   Medication Sig Start Date End Date Taking? Authorizing Provider    Family History Family History  Problem Relation Age of Onset   Diabetes Maternal Grandmother    Heart disease Maternal Grandmother    Thyroid disease Maternal Grandmother    Heart attack Maternal Grandmother    Hypertension Paternal Grandmother    Glaucoma Paternal Grandmother    Heart attack Paternal Grandmother    Social History Social History   Tobacco Use   Smoking status: Never   Smokeless tobacco: Never  Vaping Use   Vaping Use: Never used  Substance Use Topics   Alcohol use: Not Currently     Comment: occ   Drug use: No   Allergies   Patient has no known allergies.  Review of Systems Review of Systems Pertinent findings noted in history of present illness.   Physical Exam Triage Vital Signs ED Triage Vitals  Enc Vitals Group     BP 01/25/21 0827 (!) 147/82     Pulse Rate 01/25/21 0827 72     Resp 01/25/21 0827 18     Temp 01/25/21 0827 98.3 F (36.8 C)     Temp Source 01/25/21 0827 Oral     SpO2 01/25/21 0827 98 %     Weight --      Height --      Head Circumference --      Peak Flow --      Pain Score 01/25/21 0826 5     Pain Loc --      Pain Edu? --      Excl. in GC? --   No data found.  Updated Vital Signs BP 105/65 (BP Location: Left Arm)   Pulse 96   Temp 98.2 F (36.8 C) (Oral)   Resp 16   LMP  08/17/2021   SpO2 96%   Breastfeeding No   Physical Exam Musculoskeletal:        General: Normal range of motion.     Right knee: Normal.     Left knee: No swelling, deformity, effusion, erythema, ecchymosis, lacerations, bony tenderness or crepitus. Normal range of motion. Tenderness present over the medial joint line and MCL. No LCL laxity, MCL laxity, ACL laxity or PCL laxity.Normal alignment, normal meniscus and normal patellar mobility. Normal pulse.    Visual Acuity Right Eye Distance:   Left Eye Distance:   Bilateral Distance:    Right Eye Near:   Left Eye Near:    Bilateral Near:     [facility] Couse / Diagnostics / Procedures:    EKG  Radiology DG Knee Complete 4 Views Left  Result Date: 08/27/2021 CLINICAL DATA:  Left knee pain EXAM: LEFT KNEE - COMPLETE 4+ VIEW COMPARISON:  None Available. FINDINGS: No evidence of fracture, dislocation, or joint effusion. No evidence of arthropathy or other focal bone abnormality. Soft tissues are unremarkable. IMPRESSION: Negative. Electronically Signed   By: Duanne Guess D.O.   On: 08/27/2021 14:31    Procedures Procedures (including critical care time)  UC Diagnoses / Final Clinical Impressions(s)    I have reviewed the triage vital signs and the nursing notes.  Pertinent labs & imaging results that were available during my care of the patient were reviewed by me and considered in my medical decision making (see chart for details).    Final diagnoses:  Sprain of medial collateral ligament of left knee, initial encounter   Patient placed in a knee brace and advised to follow-up with [facility] if not improving.  ED Prescriptions   None    PDMP not reviewed this encounter.  Pending results:  Labs Reviewed - No data to display  Medications Ordered in UC: Medications - No data to display  Disposition Upon Discharge:  Condition: stable for discharge home Home: take medications as prescribed; routine discharge instructions as discussed; follow up as advised.  Patient presented with an acute illness with associated systemic symptoms and significant discomfort requiring urgent management. In my opinion, this is a condition that a prudent lay person (someone who possesses an average knowledge of health and medicine) may potentially expect to result in complications if not addressed urgently such as respiratory distress, impairment of bodily function or dysfunction of bodily organs.   Routine symptom specific, illness specific and/or disease specific instructions were discussed with the patient and/or caregiver at length.   As such, the patient has been evaluated and assessed, work-up was performed and treatment was provided in alignment with urgent care protocols and evidence based medicine.  Patient/parent/caregiver has been advised that the patient may require follow up for further testing and treatment if the symptoms continue in spite of treatment, as clinically indicated and appropriate.  Patient/parent/caregiver has been advised to return to the [facility] or PCP if no better; to PCP or the Emergency Department if new signs and symptoms develop, or if the current signs or symptoms  continue to change or worsen for further workup, evaluation and treatment as clinically indicated and appropriate  The patient will follow up with their current PCP if and as advised. If the patient does not currently have a PCP we will assist them in obtaining one.   The patient may need specialty follow up if the symptoms continue, in spite of conservative treatment and management, for further workup, evaluation, consultation and treatment as  clinically indicated and appropriate.   Patient/parent/caregiver verbalized understanding and agreement of plan as discussed.  All questions were addressed during visit.  Please see discharge instructions below for further details of plan.  Discharge Instructions:   Discharge Instructions      You were provided with a knee sleeve for stability to wear until your knee is feeling better.  By Friday of this week, if you are not feeling any better or you begin to feel worse, please go to [facility] walk[facility]in [facility] for further evaluation.  You may need to have an MRI done.  Ibuprofen is not recommended because it can slow the healing process.  You are welcome to apply ice to your knee as often as you like.  Thank you for visiting urgent care today.      This office note has been dictated using Teaching laboratory technicianDragon speech recognition software.  Unfortunately, and despite my best efforts, this method of dictation can sometimes lead to occasional typographical or grammatical errors.  I apologize in advance if this occurs.     Theadora RamaMorgan, Kirsta Probert Scales, PA-C 08/27/21 1441

## 2021-08-27 NOTE — ED Triage Notes (Signed)
Pt states while cleaning something off the floor her leg kind of slid from underneath her and she is now having left knee pain.

## 2021-08-27 NOTE — Discharge Instructions (Signed)
You were provided with a knee sleeve for stability to wear until your knee is feeling better.  By Friday of this week, if you are not feeling any better or you begin to feel worse, please go to [facility] for further evaluation.  You may need to have an MRI done.  Ibuprofen is not recommended because it can slow the healing process.  You are welcome to apply ice to your knee as often as you like.  Thank you for visiting urgent care today.

## 2021-12-31 ENCOUNTER — Encounter (HOSPITAL_BASED_OUTPATIENT_CLINIC_OR_DEPARTMENT_OTHER): Payer: Self-pay

## 2021-12-31 ENCOUNTER — Other Ambulatory Visit: Payer: Self-pay

## 2021-12-31 ENCOUNTER — Emergency Department (HOSPITAL_BASED_OUTPATIENT_CLINIC_OR_DEPARTMENT_OTHER)
Admission: EM | Admit: 2021-12-31 | Discharge: 2021-12-31 | Disposition: A | Discharge status: Home / Self Care | Payer: Medicaid Other | Attending: Emergency Medicine | Admitting: Emergency Medicine

## 2021-12-31 ENCOUNTER — Emergency Department (HOSPITAL_BASED_OUTPATIENT_CLINIC_OR_DEPARTMENT_OTHER): Payer: Medicaid Other

## 2021-12-31 DIAGNOSIS — R102 Pelvic and perineal pain: Secondary | ICD-10-CM | POA: Diagnosis not present

## 2021-12-31 DIAGNOSIS — R1032 Left lower quadrant pain: Secondary | ICD-10-CM | POA: Diagnosis present

## 2021-12-31 LAB — URINALYSIS, ROUTINE W REFLEX MICROSCOPIC
Bilirubin Urine: NEGATIVE
Glucose, UA: NEGATIVE mg/dL
Hgb urine dipstick: NEGATIVE
Ketones, ur: NEGATIVE mg/dL
Leukocytes,Ua: NEGATIVE
Nitrite: NEGATIVE
Protein, ur: NEGATIVE mg/dL
Specific Gravity, Urine: 1.024 (ref 1.005–1.030)
pH: 6.5 (ref 5.0–8.0)

## 2021-12-31 LAB — PREGNANCY, URINE: Preg Test, Ur: NEGATIVE

## 2021-12-31 NOTE — ED Provider Notes (Signed)
[facility] DEPT Provider Note   CSN: [id number] Arrival date & time: 12/31/21  1004     History  Chief Complaint  Patient presents with   Abdominal Pain    Kelly Thompson is a 31 y.o. female.  Patient complains of lower abdominal discomfort patient reports that she feels a knot in the left side of her abdomen.  Patient is concerned that she could have an ovarian cyst or a fibroid.  Patient reports she had full STD screening 3 weeks ago.  Patient denies any vaginal discharge she denies any current STD risk.  Patient has not had any fever she has not had any chills.  Patient denies vomiting or diarrhea she denies any urinary tract symptoms.  The history is provided by the patient. No language interpreter was used.  Abdominal Pain Pain location:  Suprapubic Pain quality: aching   Pain radiates to:  Does not radiate Pain severity:  Mild Timing:  Constant Chronicity:  New Relieved by:  Nothing Ineffective treatments:  None tried Associated symptoms: no anorexia   Risk factors: no alcohol abuse, has not had multiple surgeries and not pregnant        Home Medications Prior to Admission medications   Not on File      Allergies    Patient has no known allergies.    Review of Systems   Review of Systems  Gastrointestinal:  Positive for abdominal pain. Negative for anorexia.  All other systems reviewed and are negative.   Physical Exam Updated Vital Signs BP 109/75 (BP Location: Right Arm)   Pulse 72   Temp 97.7 F (36.5 C)   Resp 16   LMP 12/16/2021 (Exact Date)   SpO2 100%  Physical Exam Vitals and nursing note reviewed.  Constitutional:      Appearance: She is well-developed.  HENT:     Head: Normocephalic.  Cardiovascular:     Rate and Rhythm: Normal rate and regular rhythm.  Pulmonary:     Effort: Pulmonary effort is normal.  Abdominal:     General: Abdomen is flat.     Palpations: Abdomen is soft.     Tenderness: There is no  abdominal tenderness.     Hernia: No hernia is present.  Musculoskeletal:        General: Normal range of motion.     Cervical back: Normal range of motion.  Skin:    General: Skin is warm.  Neurological:     General: No focal deficit present.     Mental Status: She is alert and oriented to person, place, and time.     ED Results / Procedures / Treatments   Labs (all labs ordered are listed, but only abnormal results are displayed) Labs Reviewed  PREGNANCY, URINE  URINALYSIS, ROUTINE W REFLEX MICROSCOPIC    EKG None  Radiology US PELVIC COMPLETE W TRANSVAGINAL AND TORSION R/O  Result Date: 12/31/2021 CLINICAL DATA:  Midline pelvic pressure 3 months. EXAM: TRANSABDOMINAL AND TRANSVAGINAL ULTRASOUND OF PELVIS DOPPLER ULTRASOUND OF OVARIES TECHNIQUE: Both transabdominal and transvaginal ultrasound examinations of the pelvis were performed. Transabdominal technique was performed for global imaging of the pelvis including uterus, ovaries, adnexal regions, and pelvic cul-de-sac. It was necessary to proceed with endovaginal exam following the transabdominal exam to visualize the endometrium and ovaries. Color and duplex Doppler ultrasound was utilized to evaluate blood flow to the ovaries. COMPARISON:  None Available. FINDINGS: Uterus Measurements: 10.0 x 4.9 x 5.0 cm = volume: 127 ML. No fibroids or  other mass visualized. Endometrium Thickness: 10.4 mm.  No focal abnormality visualized. Right ovary Measurements: 3.2 x 3.5 x 1.9 cm = volume: 0.9 mL. Normal appearance/no adnexal mass. Left ovary Measurements: 4.0 x 3.7 x 2.9 cm = volume: 2.2 mL. Normal appearance/no adnexal mass. Pulsed Doppler evaluation of both ovaries demonstrates normal low-resistance arterial and venous waveforms. Other findings Trace free pelvic fluid. IMPRESSION: 1. Normal uterus and ovaries. 2. Trace free pelvic fluid. Electronically Signed   By: Elberta Fortis M.D.   On: 12/31/2021 13:13    Procedures Procedures     Medications Ordered in ED Medications - No data to display  ED Course/ Medical Decision Making/ A&P Clinical Course as of 12/31/21 1804  Tue Dec 31, 2021  1403 US PELVIC COMPLETE W TRANSVAGINAL AND TORSION R/O [LS]    Clinical Course User Index [LS] Elson Areas, PA-C                           Medical Decision Making Patient complains of feeling a knot in the suprapubic area  Amount and/or Complexity of Data Reviewed Labs: ordered. Decision-making details documented in ED Course.    Details: Urine and urine pregnancy are negative.  Labs ordered reviewed and interpreted Radiology: ordered and independent interpretation performed. Decision-making details documented in ED Course.    Details: Pelvic ultrasound and torsion study to rule out torsion/ovarian cyst.  Radiologist reports studies are negative  Risk Risk Details: I reexamined patient patient's abdomen is nontender I am unable to appreciate any type of mass she does not have a hernia.  Patient does not want repeat STD testing.  I advised patient to schedule follow-up with her primary care physician           Final Clinical Impression(s) / ED Diagnoses Final diagnoses:  Pelvic pain    Rx / DC Orders ED Discharge Orders     None     An After Visit Summary was printed and given to the patient.     Elson Areas, [place] 12/31/21 1808    Terrilee Files, MD 12/31/21 [phone]

## 2021-12-31 NOTE — ED Notes (Signed)
Lower abdominal/pelvic pain, in the middle and worsens upon palpation. No trouble urinating but has noticed increase times. No burning or bleeding while urinating. Ongoing for 2 months. Has a OB appointment for Nov, which was the earliest.

## 2021-12-31 NOTE — Discharge Instructions (Signed)
See your Physician for recheck.  Return if any problems.  °

## 2021-12-31 NOTE — ED Triage Notes (Signed)
Pt presents POV from home, ambulatory    Pt reports suprapubic "fullness" x2 months, has had 2 menstrual cycles, 2 negative pregnancy tests.  Pt c/o urinary frequency, associated with dysuria.  Pt denies burning with urination, no vaginal odor or d/c,  Denies any new sexual partners. 2 weeks ago had a full STD panel, negative results. [facility]. They cant see her until November

## 2021-12-31 NOTE — ED Notes (Signed)
Discharge paperwork given and verbally understood. 

## 2022-02-19 ENCOUNTER — Other Ambulatory Visit (HOSPITAL_COMMUNITY)
Admission: RE | Admit: 2022-02-19 | Discharge: 2022-02-19 | Disposition: A | Discharge status: Home / Self Care | Payer: Medicaid Other | Source: Ambulatory Visit | Attending: Obstetrics | Admitting: Obstetrics

## 2022-02-19 ENCOUNTER — Encounter: Payer: Self-pay | Admitting: Obstetrics

## 2022-02-19 ENCOUNTER — Ambulatory Visit (INDEPENDENT_AMBULATORY_CARE_PROVIDER_SITE_OTHER): Payer: Medicaid Other | Admitting: Obstetrics

## 2022-02-19 VITALS — BP 107/68 | HR 72 | Ht 65.5 in | Wt 164.3 lb

## 2022-02-19 DIAGNOSIS — Z3009 Encounter for other general counseling and advice on contraception: Secondary | ICD-10-CM

## 2022-02-19 DIAGNOSIS — Z01419 Encounter for gynecological examination (general) (routine) without abnormal findings: Secondary | ICD-10-CM

## 2022-02-19 NOTE — Progress Notes (Signed)
Pt presents for AEX. Pt declines STD testing and flu vaccine today.

## 2022-02-19 NOTE — Progress Notes (Signed)
Subjective:        Kelly Thompson is a 31 y.o. female here for a routine exam.  Current complaints: None.    Personal health questionnaire:  Is patient Ashkenazi Jewish, have a family history of breast and/or ovarian cancer: no Is there a family history of uterine cancer diagnosed at age < 73, gastrointestinal cancer, urinary tract cancer, family member who is a Personnel officer syndrome-associated carrier: no Is the patient overweight and hypertensive, family history of diabetes, personal history of gestational diabetes, preeclampsia or PCOS: no Is patient over 78, have PCOS,  family history of premature CHD under age 16, diabetes, smoke, have hypertension or peripheral artery disease:  no At any time, has a partner hit, kicked or otherwise hurt or frightened you?: no Over the past 2 weeks, have you felt down, depressed or hopeless?: no Over the past 2 weeks, have you felt little interest or pleasure in doing things?:no   Gynecologic History Patient's last menstrual period was 02/03/2022. Contraception: none Last Pap: 2020. Results were: normal Last mammogram: n/a. Results were: n/a  Obstetric History OB History  Gravida Para Term Preterm AB Living  1 1 1     1   SAB IAB Ectopic Multiple Live Births        0 1    # Outcome Date GA Lbr Len/2nd Weight Sex Delivery Anes PTL Lv  1 Term 01/24/20 [redacted]w[redacted]d 21:52 / 02:03 7 lb 13.2 oz (3.55 kg) M Vag-Spont EPI  LIV     Birth Comments: WDL    Past Medical History:  Diagnosis Date   Asthma    allergy induced, environmental   Irregular menstrual bleeding     Past Surgical History:  Procedure Laterality Date   WISDOM TOOTH EXTRACTION      No current outpatient medications on file. No Known Allergies  Social History   Tobacco Use   Smoking status: Never   Smokeless tobacco: Never  Substance Use Topics   Alcohol use: Yes    Comment: occ    Family History  Problem Relation Age of Onset   Diabetes Maternal Grandmother    Heart disease  Maternal Grandmother    Thyroid disease Maternal Grandmother    Heart attack Maternal Grandmother    Hypertension Paternal Grandmother    Glaucoma Paternal Grandmother    Heart attack Paternal Grandmother       Review of Systems  Constitutional: negative for fatigue and weight loss Respiratory: negative for cough and wheezing Cardiovascular: negative for chest pain, fatigue and palpitations Gastrointestinal: negative for abdominal pain and change in bowel habits Musculoskeletal:negative for myalgias Neurological: negative for gait problems and tremors Behavioral/Psych: negative for abusive relationship, depression Endocrine: negative for temperature intolerance    Genitourinary:negative for abnormal menstrual periods, genital lesions, hot flashes, sexual problems and vaginal discharge Integument/breast: negative for breast lump, breast tenderness, nipple discharge and skin lesion(s)    Objective:       BP 107/68   Pulse 72   Ht 5' 5.5" (1.664 m)   Wt 164 lb 4.8 oz (74.5 kg)   LMP 02/03/2022   BMI 26.93 kg/m  General:   Alert and no distress  Skin:   no rash or abnormalities  Lungs:   clear to auscultation bilaterally  Heart:   regular rate and rhythm, S1, S2 normal, no murmur, click, rub or gallop  Breasts:   normal without suspicious masses, skin or nipple changes or axillary nodes  Abdomen:  normal findings: no organomegaly, soft, non-tender  and no hernia  Pelvis:  External genitalia: normal general appearance Urinary system: urethral meatus normal and bladder without fullness, nontender Vaginal: normal without tenderness, induration or masses Cervix: normal appearance Adnexa: normal bimanual exam Uterus: anteverted and non-tender, normal size   Lab Review Urine pregnancy test Labs reviewed yes Radiologic studies reviewed no  I have spent a total of 20 minutes of face-to-face time, excluding clinical staff time, reviewing notes and preparing to see patient,  ordering tests and/or medications, and counseling the patient.   Assessment:    1. Encounter for gynecological examination with Papanicolaou smear of cervix Rx: - Cytology - PAP( [place])  2. Encounter for other general counseling and advice on contraception - declines - condoms recommended for STD prevention   Plan:    Education reviewed: calcium supplements, depression evaluation, low fat, low cholesterol diet, safe sex/STD prevention, self breast exams, and weight bearing exercise. Contraception: none. Follow up in: 1 year.    Brock Bad, MD 02/19/2022 1:47 PM

## 2022-03-06 LAB — CYTOLOGY - PAP
Comment: NEGATIVE
Diagnosis: UNDETERMINED — AB
High risk HPV: NEGATIVE

## 2022-03-07 ENCOUNTER — Telehealth: Payer: Self-pay | Admitting: Emergency Medicine

## 2022-03-07 NOTE — Telephone Encounter (Signed)
-----   Message from Brock Bad, MD sent at 03/06/2022  4:31 PM EST ----- ASCUS with negative High Risk HPV.  Repeat pap in 1 year.

## 2022-03-07 NOTE — Telephone Encounter (Signed)
Attempted TC to patient to discuss results. LVM to return call to clinic.  

## 2022-03-11 NOTE — Progress Notes (Signed)
Advised pt of results and need for repeat Pap in 1 year. Pt verbalized understanding. All questions answered.

## 2022-04-22 ENCOUNTER — Ambulatory Visit (INDEPENDENT_AMBULATORY_CARE_PROVIDER_SITE_OTHER): Payer: Medicaid Other | Admitting: General Practice

## 2022-04-22 VITALS — BP 105/73 | HR 76 | Ht 65.5 in | Wt 165.3 lb

## 2022-04-22 DIAGNOSIS — Z3200 Encounter for pregnancy test, result unknown: Secondary | ICD-10-CM

## 2022-04-22 DIAGNOSIS — Z3201 Encounter for pregnancy test, result positive: Secondary | ICD-10-CM

## 2022-04-22 DIAGNOSIS — Z3480 Encounter for supervision of other normal pregnancy, unspecified trimester: Secondary | ICD-10-CM | POA: Insufficient documentation

## 2022-04-22 LAB — POCT URINE PREGNANCY: Preg Test, Ur: POSITIVE — AB

## 2022-04-22 NOTE — Progress Notes (Signed)
Kelly Thompson presents today for UPT. She has no unusual complaints. LMP:03-24-22    OBJECTIVE: Appears well, in no apparent distress.  OB History     Gravida  1   Para  1   Term  1   Preterm      AB      Living  1      SAB      IAB      Ectopic      Multiple  0   Live Births  1          Home UPT Result: Positive  In-Office UPT result: Positive (faint line) I have reviewed the patient's medical, obstetrical, social, and family histories, and medications.   ASSESSMENT: Positive pregnancy test  PLAN Prenatal care to be completed at: [facility]

## 2022-06-09 ENCOUNTER — Encounter: Payer: Medicaid Other | Admitting: Obstetrics and Gynecology

## 2023-06-07 IMAGING — DX DG KNEE COMPLETE 4+V*L*
4 series · 4 of 4 positions shown · non-contrast
Comparison: None Available.

CLINICAL DATA: Left knee pain

EXAM:
LEFT KNEE - COMPLETE 4+ VIEW

[knee ap]
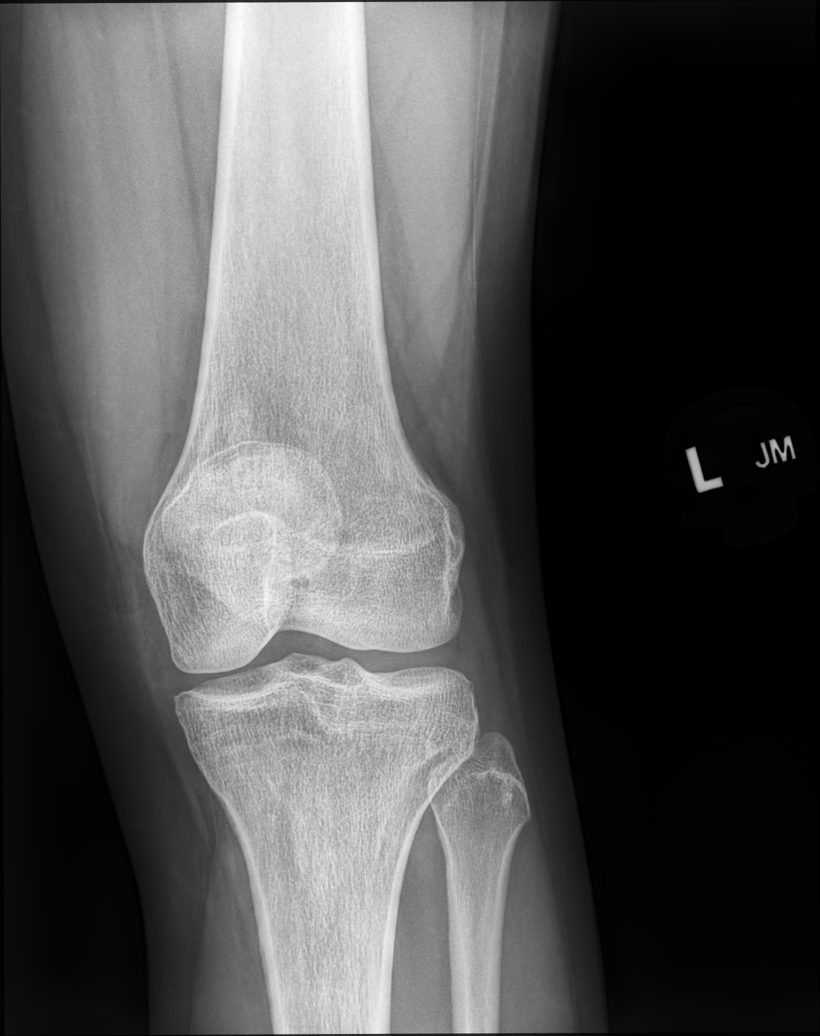

[knee obl]
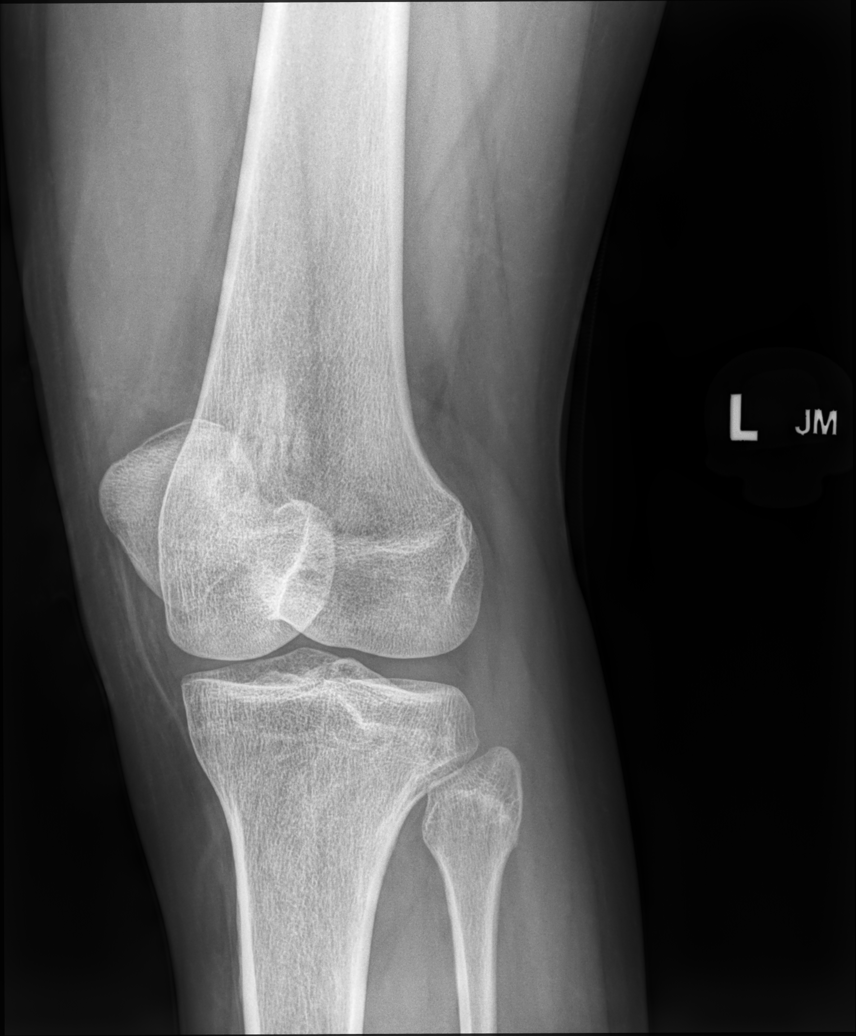

[knee lat]
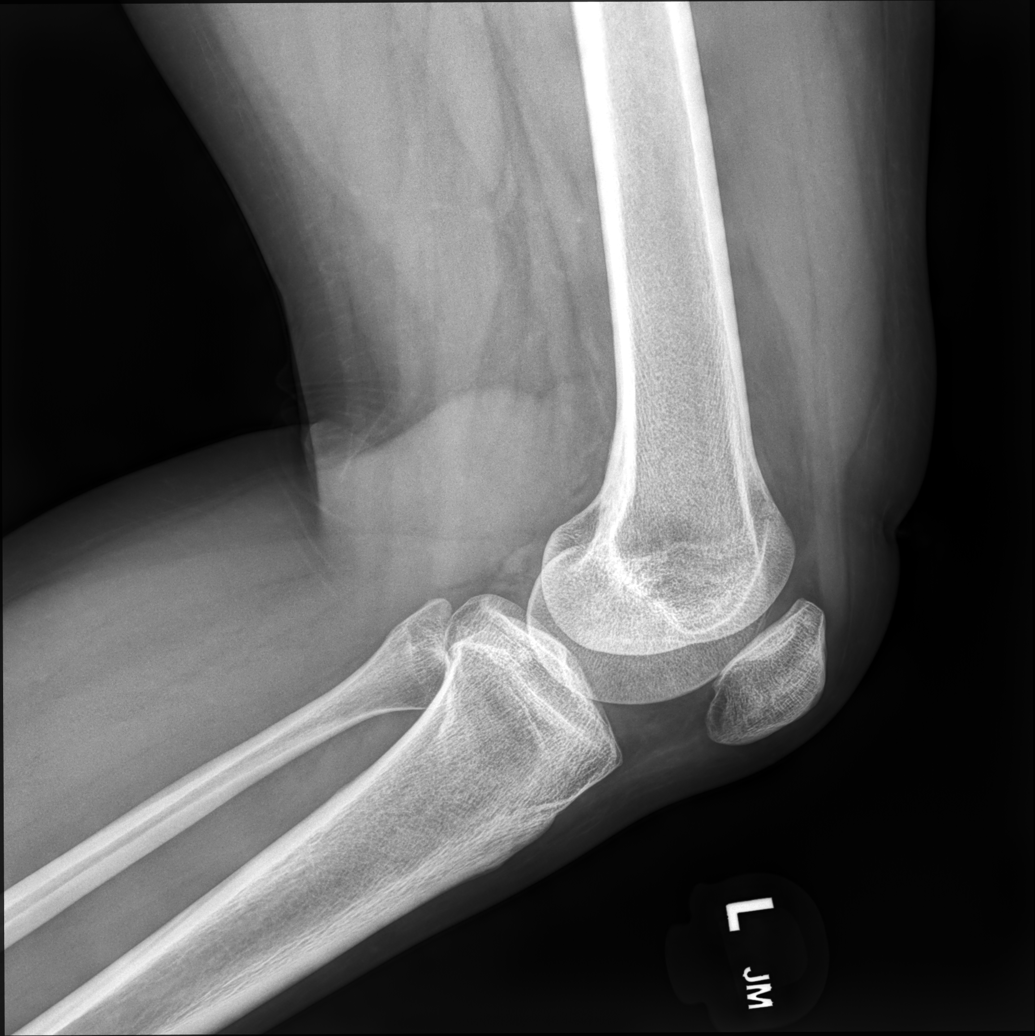

[patella (sunrise) tan]
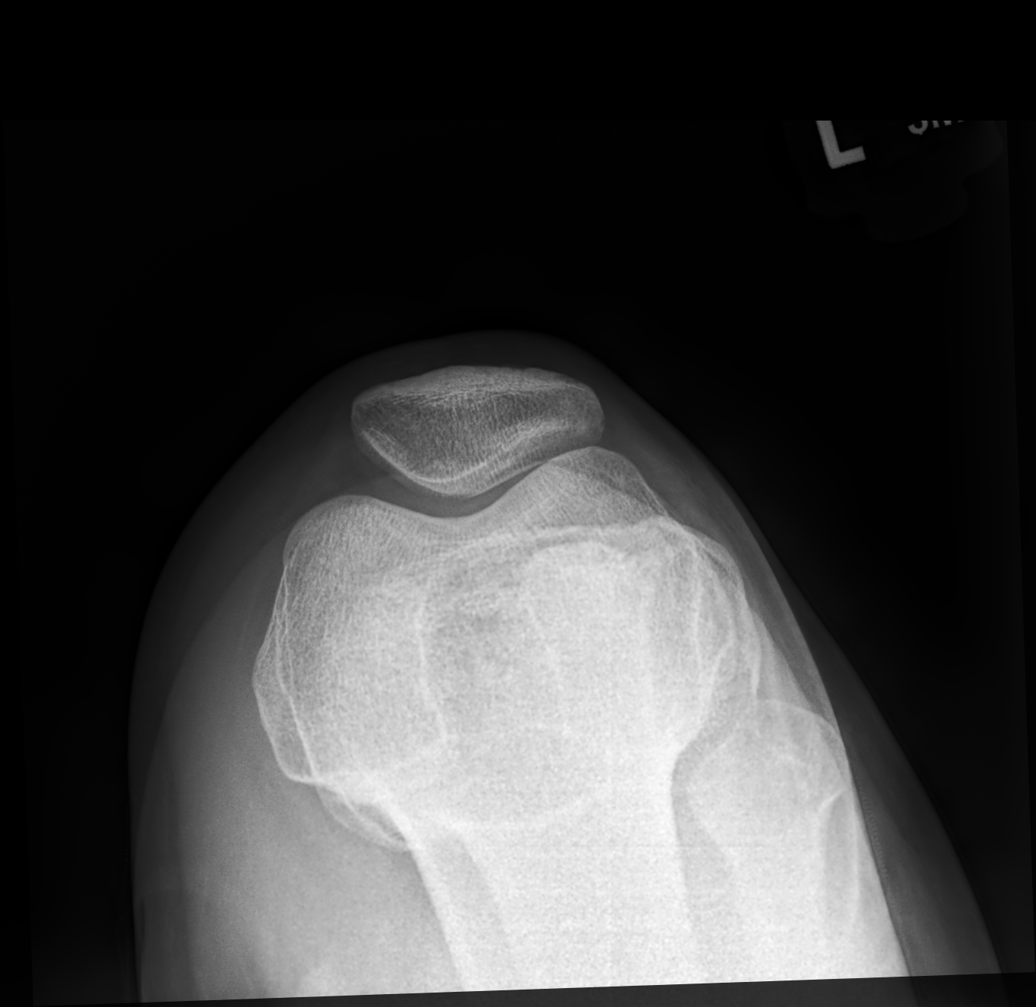

[4 of 4 positions shown; findings below may reference images not displayed]

FINDINGS: No evidence of fracture, dislocation, or joint effusion. No evidence
of arthropathy or other focal bone abnormality. Soft tissues are
unremarkable.
IMPRESSION: Negative.
